# Patient Record
Sex: Female | Born: 1990 | Race: White | Hispanic: No | Marital: Single | Smoking: Never smoker
Health system: Southern US, Community
[De-identification: ages and names within clinical notes are randomized; demographics above are authoritative.]

## PROBLEM LIST (undated history)

## (undated) DIAGNOSIS — J45909 Unspecified asthma, uncomplicated: Secondary | ICD-10-CM

## (undated) DIAGNOSIS — R87612 Low grade squamous intraepithelial lesion on cytologic smear of cervix (LGSIL): Secondary | ICD-10-CM

## (undated) DIAGNOSIS — F909 Attention-deficit hyperactivity disorder, unspecified type: Secondary | ICD-10-CM

## (undated) DIAGNOSIS — R87629 Unspecified abnormal cytological findings in specimens from vagina: Secondary | ICD-10-CM

## (undated) HISTORY — DX: Unspecified abnormal cytological findings in specimens from vagina: R87.629

## (undated) HISTORY — PX: KNEE ARTHROSCOPY: SHX127

## (undated) HISTORY — DX: Unspecified asthma, uncomplicated: J45.909

## (undated) HISTORY — PX: NO PAST SURGERIES: SHX2092

## (undated) HISTORY — DX: Low grade squamous intraepithelial lesion on cytologic smear of cervix (LGSIL): R87.612

---
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
## 2010-02-12 ENCOUNTER — Emergency Department (HOSPITAL_COMMUNITY): Admission: EM | Admit: 2010-02-12 | Discharge: 2010-02-12 | Payer: Self-pay | Admitting: Family Medicine

## 2010-04-29 NOTE — Progress Notes (Signed)
Summary: triage/ear pain  Phone Note Call from Patient   Caller: Mom Reason for Call: Talk to Nurse Summary of Call: Mom called to say patient has been having ear aches for a couple of weeks now.Marland KitchenMarland KitchenIt started about the same time she went swimming. Patient can hear echo when she talks and feels like she has water in ear..Ear canal painful.  Advised mother we do not have any appointments open today. Patient can try OTC  medication for swimmers ear. She should talk to the pharmacists at the drug store.  Advised she can go to [facility] if she feels like she wanted to be seen today or if OTC meds do not help. Patient can take ibuprofen for pain as needed if not allergic. She should take with food. Mom states understanding. Initial call taken by: Conchita Paris,  November 06, 2009 11:29 AM

## 2010-05-15 ENCOUNTER — Emergency Department (HOSPITAL_COMMUNITY): Payer: No Typology Code available for payment source

## 2010-05-15 ENCOUNTER — Emergency Department (HOSPITAL_COMMUNITY)
Admission: EM | Admit: 2010-05-15 | Discharge: 2010-05-15 | Disposition: A | Discharge status: Home / Self Care | Payer: No Typology Code available for payment source | Attending: Emergency Medicine | Admitting: Emergency Medicine

## 2010-05-15 DIAGNOSIS — J45909 Unspecified asthma, uncomplicated: Secondary | ICD-10-CM | POA: Insufficient documentation

## 2010-05-15 DIAGNOSIS — F988 Other specified behavioral and emotional disorders with onset usually occurring in childhood and adolescence: Secondary | ICD-10-CM | POA: Insufficient documentation

## 2010-05-15 DIAGNOSIS — S060X0A Concussion without loss of consciousness, initial encounter: Secondary | ICD-10-CM | POA: Insufficient documentation

## 2010-05-15 DIAGNOSIS — S0120XA Unspecified open wound of nose, initial encounter: Secondary | ICD-10-CM | POA: Insufficient documentation

## 2010-05-15 DIAGNOSIS — E669 Obesity, unspecified: Secondary | ICD-10-CM | POA: Insufficient documentation

## 2010-05-15 DIAGNOSIS — IMO0002 Reserved for concepts with insufficient information to code with codable children: Secondary | ICD-10-CM | POA: Insufficient documentation

## 2010-05-15 LAB — CBC
Hemoglobin: 12.8 g/dL (ref 12.0–15.0)
Platelets: 284 10*3/uL (ref 150–400)
RBC: 4.06 MIL/uL (ref 3.87–5.11)
WBC: 5.8 10*3/uL (ref 4.0–10.5)

## 2010-05-15 LAB — POCT I-STAT, CHEM 8
BUN: 6 mg/dL (ref 6–23)
HCT: 38 % (ref 36.0–46.0)
Hemoglobin: 12.9 g/dL (ref 12.0–15.0)
Sodium: 139 mEq/L (ref 135–145)
TCO2: 20 mmol/L (ref 0–100)

## 2010-05-15 LAB — BASIC METABOLIC PANEL
CO2: 23 mEq/L (ref 19–32)
Chloride: 109 mEq/L (ref 96–112)
GFR calc Af Amer: >60 mL/min (ref >60)
Sodium: 140 mEq/L (ref 135–145)

## 2010-05-15 LAB — DIFFERENTIAL
Basophils Absolute: 0 10*3/uL (ref 0.0–0.1)
Basophils Relative: 1 % (ref 0–1)
Eosinophils Absolute: 0.2 10*3/uL (ref 0.0–0.7)
Neutro Abs: 3.1 10*3/uL (ref 1.7–7.7)
Neutrophils Relative %: 54 % (ref 43–77)
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
## 2010-06-08 LAB — POCT URINALYSIS DIPSTICK
Bilirubin Urine: NEGATIVE
Glucose, UA: 100 mg/dL — AB
Hgb urine dipstick: NEGATIVE
Ketones, ur: 40 mg/dL — AB
Nitrite: NEGATIVE
pH: 6 (ref 5.0–8.0)

## 2012-03-28 DIAGNOSIS — R87629 Unspecified abnormal cytological findings in specimens from vagina: Secondary | ICD-10-CM

## 2012-03-28 HISTORY — DX: Unspecified abnormal cytological findings in specimens from vagina: R87.629

## 2014-02-15 ENCOUNTER — Encounter (HOSPITAL_COMMUNITY): Payer: Self-pay | Admitting: Nurse Practitioner

## 2014-02-15 ENCOUNTER — Emergency Department (HOSPITAL_COMMUNITY): Payer: 59

## 2014-02-15 ENCOUNTER — Encounter (HOSPITAL_COMMUNITY): Payer: Self-pay | Admitting: *Deleted

## 2014-02-15 ENCOUNTER — Emergency Department (HOSPITAL_COMMUNITY)
Admission: EM | Admit: 2014-02-15 | Discharge: 2014-02-15 | Disposition: A | Discharge status: Home / Self Care | Payer: 59 | Attending: Emergency Medicine | Admitting: Emergency Medicine

## 2014-02-15 ENCOUNTER — Emergency Department (INDEPENDENT_AMBULATORY_CARE_PROVIDER_SITE_OTHER)
Admission: EM | Admit: 2014-02-15 | Discharge: 2014-02-15 | Disposition: A | Discharge status: Home / Self Care | Payer: 59 | Source: Home / Self Care | Attending: Emergency Medicine | Admitting: Emergency Medicine

## 2014-02-15 DIAGNOSIS — S39011A Strain of muscle, fascia and tendon of abdomen, initial encounter: Secondary | ICD-10-CM | POA: Insufficient documentation

## 2014-02-15 DIAGNOSIS — F909 Attention-deficit hyperactivity disorder, unspecified type: Secondary | ICD-10-CM | POA: Insufficient documentation

## 2014-02-15 DIAGNOSIS — Y990 Civilian activity done for income or pay: Secondary | ICD-10-CM | POA: Insufficient documentation

## 2014-02-15 DIAGNOSIS — X58XXXA Exposure to other specified factors, initial encounter: Secondary | ICD-10-CM | POA: Diagnosis not present

## 2014-02-15 DIAGNOSIS — R1012 Left upper quadrant pain: Secondary | ICD-10-CM | POA: Diagnosis present

## 2014-02-15 DIAGNOSIS — Z79899 Other long term (current) drug therapy: Secondary | ICD-10-CM | POA: Diagnosis not present

## 2014-02-15 DIAGNOSIS — Y9289 Other specified places as the place of occurrence of the external cause: Secondary | ICD-10-CM | POA: Diagnosis not present

## 2014-02-15 DIAGNOSIS — Y9389 Activity, other specified: Secondary | ICD-10-CM | POA: Diagnosis not present

## 2014-02-15 HISTORY — DX: Attention-deficit hyperactivity disorder, unspecified type: F90.9

## 2014-02-15 LAB — CBC WITH DIFFERENTIAL/PLATELET
Basophils Absolute: 0.1 10*3/uL (ref 0.0–0.1)
Basophils Relative: 1 % (ref 0–1)
Eosinophils Absolute: 0.2 10*3/uL (ref 0.0–0.7)
Eosinophils Relative: 2 % (ref 0–5)
HCT: 41.4 % (ref 36.0–46.0)
Hemoglobin: 13.8 g/dL (ref 12.0–15.0)
LYMPHS ABS: 2.2 10*3/uL (ref 0.7–4.0)
LYMPHS PCT: 30 % (ref 12–46)
MCH: 30.8 pg (ref 26.0–34.0)
MCHC: 33.3 g/dL (ref 30.0–36.0)
MCV: 92.4 fL (ref 78.0–100.0)
Monocytes Absolute: 0.5 10*3/uL (ref 0.1–1.0)
Monocytes Relative: 6 % (ref 3–12)
NEUTROS PCT: 61 % (ref 43–77)
Neutro Abs: 4.5 10*3/uL (ref 1.7–7.7)
PLATELETS: 381 10*3/uL (ref 150–400)
RBC: 4.48 MIL/uL (ref 3.87–5.11)
RDW: 12.2 % (ref 11.5–15.5)
WBC: 7.4 10*3/uL (ref 4.0–10.5)

## 2014-02-15 LAB — COMPREHENSIVE METABOLIC PANEL
ALT: 29 U/L (ref 0–35)
ANION GAP: 16 — AB (ref 5–15)
AST: 32 U/L (ref 0–37)
Albumin: 3.6 g/dL (ref 3.5–5.2)
Alkaline Phosphatase: 39 U/L (ref 39–117)
BUN: 8 mg/dL (ref 6–23)
CALCIUM: 9.4 mg/dL (ref 8.4–10.5)
CO2: 23 meq/L (ref 19–32)
CREATININE: 0.75 mg/dL (ref 0.50–1.10)
Chloride: 101 mEq/L (ref 96–112)
GLUCOSE: 82 mg/dL (ref 70–99)
Potassium: 4.1 mEq/L (ref 3.7–5.3)
Sodium: 140 mEq/L (ref 137–147)
Total Bilirubin: 0.3 mg/dL (ref 0.3–1.2)
Total Protein: 8.1 g/dL (ref 6.0–8.3)

## 2014-02-15 LAB — POCT URINALYSIS DIP (DEVICE)
Bilirubin Urine: NEGATIVE
Glucose, UA: NEGATIVE mg/dL
HGB URINE DIPSTICK: NEGATIVE
Nitrite: NEGATIVE
PROTEIN: NEGATIVE mg/dL
SPECIFIC GRAVITY, URINE: 1.02 (ref 1.005–1.030)
UROBILINOGEN UA: 1 mg/dL (ref 0.0–1.0)
pH: 7 (ref 5.0–8.0)

## 2014-02-15 LAB — LIPASE, BLOOD: LIPASE: 18 U/L (ref 11–59)

## 2014-02-15 LAB — POCT PREGNANCY, URINE: PREG TEST UR: NEGATIVE

## 2014-02-15 MED ORDER — IOHEXOL 300 MG/ML  SOLN
100.0000 mL | Freq: Once | INTRAMUSCULAR | Status: AC | PRN
  Administered 2014-02-15: 100 mL via INTRAVENOUS

## 2014-02-15 MED ORDER — MORPHINE SULFATE 4 MG/ML IJ SOLN
4.0000 mg | Freq: Once | INTRAMUSCULAR | Status: AC
  Administered 2014-02-15: 4 mg via INTRAVENOUS
  Filled 2014-02-15: qty 1

## 2014-02-15 MED ORDER — CYCLOBENZAPRINE HCL 10 MG PO TABS
5.0000 mg | ORAL_TABLET | Freq: Once | ORAL | Status: AC
  Administered 2014-02-15: 5 mg via ORAL
  Filled 2014-02-15 (×2): qty 1

## 2014-02-15 MED ORDER — METHOCARBAMOL 500 MG PO TABS: 500.0000 mg | ORAL_TABLET | Freq: Two times a day (BID) | ORAL | Status: DC

## 2014-02-15 MED ORDER — OXYCODONE-ACETAMINOPHEN 5-325 MG PO TABS: 1.0000 | ORAL_TABLET | Freq: Four times a day (QID) | ORAL | Status: DC | PRN

## 2014-02-15 MED ORDER — ONDANSETRON HCL 4 MG PO TABS: 4.0000 mg | ORAL_TABLET | Freq: Four times a day (QID) | ORAL | Status: DC

## 2014-02-15 MED ORDER — SODIUM CHLORIDE 0.9 % IV BOLUS (SEPSIS)
1000.0000 mL | Freq: Once | INTRAVENOUS | Status: AC
  Administered 2014-02-15: 1000 mL via INTRAVENOUS

## 2014-02-15 MED ORDER — IOHEXOL 300 MG/ML  SOLN
25.0000 mL | Freq: Once | INTRAMUSCULAR | Status: AC | PRN
  Administered 2014-02-15: 25 mL via ORAL

## 2014-02-15 MED ORDER — ONDANSETRON HCL 4 MG/2ML IJ SOLN
4.0000 mg | Freq: Once | INTRAMUSCULAR | Status: AC
  Administered 2014-02-15: 4 mg via INTRAVENOUS
  Filled 2014-02-15: qty 2

## 2014-02-15 MED ORDER — KETOROLAC TROMETHAMINE 30 MG/ML IJ SOLN
30.0000 mg | Freq: Once | INTRAMUSCULAR | Status: AC
  Administered 2014-02-15: 30 mg via INTRAVENOUS
  Filled 2014-02-15 (×2): qty 1

## 2014-02-15 NOTE — ED Notes (Signed)
ED First Nurse, Karolee StampsJanelle, notified of pt.

## 2014-02-15 NOTE — ED Notes (Signed)
Patient provided a urine specimen prior to being placed in exam room

## 2014-02-15 NOTE — ED Notes (Signed)
Patient finished with contrast 

## 2014-02-15 NOTE — ED Notes (Signed)
Reports "stabbing" pain in LUQ that woke her out of a sleep Thurs night; pain eventually eased so she could return to sleep.  Yesterday had some intermittent episodes, but now pain has remained constant since late yesterday.  Describes as a cramping sensation that feels like it's "rolling" from LUQ to epigastrum.  Denies constipation, n/v/d, fevers.  Pain worse with sitting and feels tender to touch.  Last BM yesterday - reports as normal.  Has tried omeprazole without any change.

## 2014-02-15 NOTE — Discharge Instructions (Signed)
°  It is my recommendation that [facility]. Please do not eat or drink on your way.  Urine studies were normal.  Abdominal Pain Many things can cause abdominal pain. Usually, abdominal pain is not caused by a disease and will improve without treatment. It can often be observed and treated at home. Your health care provider will do a physical exam and possibly order blood tests and X-rays to help determine the seriousness of your pain. However, in many cases, more time must pass before a clear cause of the pain can be found. Before that point, your health care provider may not know if you need more testing or further treatment. HOME CARE INSTRUCTIONS  Monitor your abdominal pain for any changes. The following actions may help to alleviate any discomfort you are experiencing:  Only take over-the-counter or prescription medicines as directed by your health care provider.  Do not take laxatives unless directed to do so by your health care provider.  Try a clear liquid diet (broth, tea, or water) as directed by your health care provider. Slowly move to a bland diet as tolerated. SEEK MEDICAL CARE IF:  You have unexplained abdominal pain.  You have abdominal pain associated with nausea or diarrhea.  You have pain when you urinate or have a bowel movement.  You experience abdominal pain that wakes you in the night.  You have abdominal pain that is worsened or improved by eating food.  You have abdominal pain that is worsened with eating fatty foods.  You have a fever. SEEK IMMEDIATE MEDICAL CARE IF:   Your pain does not go away within 2 hours.  You keep throwing up (vomiting).  Your pain is felt only in portions of the abdomen, such as the right side or the left lower portion of the abdomen.  You pass bloody or black tarry stools. MAKE SURE YOU:  Understand these instructions.   Will watch your condition.   Will get help right away if you  are not doing well or get worse.  Document Released: 12/22/2004 Document Revised: 03/19/2013 Document Reviewed: 11/21/2012 [facility] Patient Information 2015 [place], [place]. This information is not intended to replace advice given to you by your health care provider. Make sure you discuss any questions you have with your health care provider.

## 2014-02-15 NOTE — ED Notes (Signed)
Pt states she finished with her oral CT contrast. Annabelle Harmanana from CT informed.

## 2014-02-15 NOTE — ED Provider Notes (Signed)
CSN: [id number]     Arrival date & time 02/15/14  1613 History   First MD Initiated Contact with Patient 02/15/14 1652     Chief Complaint  Patient presents with  . Abdominal Pain   (Consider location/radiation/quality/duration/timing/severity/associated sxs/prior Treatment) Patient is a 23 y.o. female presenting with abdominal pain. The history is provided by the patient.  Abdominal Pain Pain location:  LUQ Pain quality: sharp   Pain radiates to:  Epigastric region Pain severity:  Moderate Onset quality:  Gradual Duration:  24 hours Timing:  Constant Progression:  Worsening Chronicity:  New Context: awakening from sleep   Associated symptoms: cough   Associated symptoms: no anorexia, no chest pain, no chills, no constipation, no diarrhea, no dysuria, no fatigue, no fever, no hematemesis, no hematuria, no melena, no nausea, no shortness of breath, no sore throat, no vaginal bleeding, no vaginal discharge and no vomiting   Associated symptoms comment:  +reports recent URI symptoms Risk factors: has not had multiple surgeries     Past Medical History  Diagnosis Date  . ADHD (attention deficit hyperactivity disorder)    History reviewed. No pertinent past surgical history. No family history on file. History  Substance Use Topics  . Smoking status: Never Smoker   . Smokeless tobacco: Not on file  . Alcohol Use: No   OB History    No data available     Review of Systems  Constitutional: Negative for fever, chills, appetite change and fatigue.  HENT: Negative.  Negative for sore throat.   Respiratory: Positive for cough. Negative for chest tightness and shortness of breath.   Cardiovascular: Negative for chest pain.  Gastrointestinal: Positive for abdominal pain. Negative for nausea, vomiting, diarrhea, constipation, blood in stool, melena, abdominal distention, anorexia and hematemesis.  Genitourinary: Negative.  Negative for dysuria, hematuria, vaginal bleeding and  vaginal discharge.  Musculoskeletal: Negative for back pain.  Skin: Negative.   Neurological: Negative for dizziness and light-headedness.    Allergies  Review of patient's allergies indicates no known allergies.  Home Medications   Prior to Admission medications   Medication Sig Start Date End Date Taking? Authorizing Provider  lisdexamfetamine (VYVANSE) 50 MG capsule Take 50 mg by mouth daily.   Yes Historical Provider, MD  UNKNOWN TO PATIENT BCPs   Yes Historical Provider, MD   BP 126/76 mmHg  Pulse 104  Temp(Src) 97.9 F (36.6 C) (Oral)  Resp 16  SpO2 98%  LMP 01/24/2014 (Approximate) Physical Exam  Constitutional: She is oriented to person, place, and time. She appears well-developed and well-nourished. No distress.  HENT:  Head: Normocephalic and atraumatic.  Eyes: Conjunctivae are normal. No scleral icterus.  Cardiovascular: Normal rate, regular rhythm and normal heart sounds.   Pulmonary/Chest: Effort normal and breath sounds normal.  Abdominal: Soft. Normal appearance and bowel sounds are normal. She exhibits no mass. There is tenderness. There is no rigidity, no rebound, no guarding and no CVA tenderness.    Neurological: She is alert and oriented to person, place, and time.  Skin: Skin is warm and dry.  Psychiatric: She has a normal mood and affect. Her behavior is normal.  Nursing note and vitals reviewed.   ED Course  Procedures (including critical care time) Labs Review Labs Reviewed  POCT URINALYSIS DIP (DEVICE) - Abnormal; Notable for the following:    Ketones, ur TRACE (*)    Leukocytes, UA SMALL (*)    All other components within normal limits  POCT PREGNANCY, URINE    Imaging  Review No results found.   MDM   1. Left upper quadrant pain   Urine studies unremarkable UPT negative Advised patient to remain NPO and [facility].     Ria ClockJennifer Lee H Odaliz Mcqueary, GeorgiaPA 02/15/14 1723

## 2014-02-15 NOTE — Discharge Instructions (Signed)
Abdominal Pain Many things can cause abdominal pain. Usually, abdominal pain is not caused by a disease and will improve without treatment. It can often be observed and treated at home. Your health care provider will do a physical exam and possibly order blood tests and X-rays to help determine the seriousness of your pain. However, in many cases, more time must pass before a clear cause of the pain can be found. Before that point, your health care provider may not know if you need more testing or further treatment. HOME CARE INSTRUCTIONS  Monitor your abdominal pain for any changes. The following actions may help to alleviate any discomfort you are experiencing:  Only take over-the-counter or prescription medicines as directed by your health care provider.  Do not take laxatives unless directed to do so by your health care provider.  Try a clear liquid diet (broth, tea, or water) as directed by your health care provider. Slowly move to a bland diet as tolerated. SEEK MEDICAL CARE IF:  You have unexplained abdominal pain.  You have abdominal pain associated with nausea or diarrhea.  You have pain when you urinate or have a bowel movement.  You experience abdominal pain that wakes you in the night.  You have abdominal pain that is worsened or improved by eating food.  You have abdominal pain that is worsened with eating fatty foods.  You have a fever. SEEK IMMEDIATE MEDICAL CARE IF:   Your pain does not go away within 2 hours.  You keep throwing up (vomiting).  Your pain is felt only in portions of the abdomen, such as the right side or the left lower portion of the abdomen.  You pass bloody or black tarry stools. MAKE SURE YOU:  Understand these instructions.   Will watch your condition.   Will get help right away if you are not doing well or get worse.  Document Released: 12/22/2004 Document Revised: 03/19/2013 Document Reviewed: 11/21/2012 [facility] Patient Information  2015 [place], [place]LL[place]. This information is not intended to replace advice given to you by your health care provider. Make sure you discuss any questions you have with your health care provider. Muscle Pain Muscle pain (myalgia) may be caused by many things, including:  Overuse or muscle strain, especially if you are not in shape. This is the most common cause of muscle pain.  Injury.  Bruises.  Viruses, such as the flu.  Infectious diseases.  Fibromyalgia, which is a chronic condition that causes muscle tenderness, fatigue, and headache.  Autoimmune diseases, including lupus.  Certain drugs, including ACE inhibitors and statins. Muscle pain may be mild or severe. In most cases, the pain lasts only a short time and goes away without treatment. To diagnose the cause of your muscle pain, your health care provider will take your medical history. This means he or she will ask you when your muscle pain began and what has been happening. If you have not had muscle pain for very long, your health care provider may want to wait before doing much testing. If your muscle pain has lasted a long time, your health care provider may want to run tests right away. If your health care provider thinks your muscle pain may be caused by illness, you may need to have additional tests to rule out certain conditions.  Treatment for muscle pain depends on the cause. Home care is often enough to relieve muscle pain. Your health care provider may also prescribe anti-inflammatory medicine. HOME CARE INSTRUCTIONS Watch your  condition for any changes. The following actions may help to lessen any discomfort you are feeling:  Only take over-the-counter or prescription medicines as directed by your health care provider.  Apply ice to the sore muscle:  Put ice in a plastic bag.  Place a towel between your skin and the bag.  Leave the ice on for 15-20 minutes, 3-4 times a day.  You may alternate applying hot and cold  packs to the muscle as directed by your health care provider.  If overuse is causing your muscle pain, slow down your activities until the pain goes away.  Remember that it is normal to feel some muscle pain after starting a workout program. Muscles that have not been used often will be sore at first.  Do regular, gentle exercises if you are not usually active.  Warm up before exercising to lower your risk of muscle pain.  Do not continue working out if the pain is very bad. Bad pain could mean you have injured a muscle. SEEK MEDICAL CARE IF:  Your muscle pain gets worse, and medicines do not help.  You have muscle pain that lasts longer than 3 days.  You have a rash or fever along with muscle pain.  You have muscle pain after a tick bite.  You have muscle pain while working out, even though you are in good physical condition.  You have redness, soreness, or swelling along with muscle pain.  You have muscle pain after starting a new medicine or changing the dose of a medicine. SEEK IMMEDIATE MEDICAL CARE IF:  You have trouble breathing.  You have trouble swallowing.  You have muscle pain along with a stiff neck, fever, and vomiting.  You have severe muscle weakness or cannot move part of your body. MAKE SURE YOU:   Understand these instructions.  Will watch your condition.  Will get help right away if you are not doing well or get worse. Document Released: 02/03/2006 Document Revised: 03/19/2013 Document Reviewed: 01/08/2013 [facility] Patient Information 2015 [place]ExitCare, [place]LLC. This information is not intended to replace advice given to you by your health care provider. Make sure you discuss any questions you have with your health care provider.

## 2014-02-15 NOTE — ED Notes (Addendum)
She reports LUQ abd pain since Thursday. She denies bowel/bladder changes, n/v, fevers. Last BM was yesterday, normal. [facility]

## 2014-02-15 NOTE — ED Provider Notes (Signed)
CSN: [id number]     Arrival date & time 02/15/14  1734 History   First MD Initiated Contact with Patient 02/15/14 1902     Chief Complaint  Patient presents with  . Abdominal Pain     (Consider location/radiation/quality/duration/timing/severity/associated sxs/prior Treatment) HPI  Patient to the ER with complaints of LUQ pain since Thursday (11/19?Marland Kitchen. She was seen at the Urgent Care and advised to go to the ED for further evaluation. She [facility] which [facility]. She does heavy lifting for work but does not remember injuring herself or lifting something heavy. She denies having any chest pains, acid reflux, cough, pain with breathing, coughing, or worsened pain with movement. She says the pain is worse with laying and sitting. Relieved with standing. She has no significant past medical history.    Past Medical History  Diagnosis Date  . ADHD (attention deficit hyperactivity disorder)    History reviewed. No pertinent past surgical history. History reviewed. No pertinent family history. History  Substance Use Topics  . Smoking status: Never Smoker   . Smokeless tobacco: Not on file  . Alcohol Use: No   OB History    No data available     Review of Systems  10 Systems reviewed and are negative for acute change except as noted in the HPI.    Allergies  Review of patient's allergies indicates no known allergies.  Home Medications   Prior to Admission medications   Medication Sig Start Date End Date Taking? Authorizing Provider  lisdexamfetamine (VYVANSE) 50 MG capsule Take 50 mg by mouth daily.   Yes Historical Provider, MD  omeprazole (PRILOSEC OTC) 20 MG tablet Take 20 mg by mouth once.   Yes Historical Provider, MD  UNKNOWN TO PATIENT OCPs. Pt does not know the name of it.   Yes Historical Provider, MD   BP 120/75 mmHg  Pulse 97  Temp(Src) 98.3 F (36.8 C) (Oral)  Resp 17  Ht 5\' 1"  (1.549 m)  Wt 150 lb (68.04 kg)  BMI 28.36  kg/m2  SpO2 95%  LMP 01/24/2014 (Approximate) Physical Exam  Constitutional: She appears well-developed and well-nourished. No distress.  HENT:  Head: Normocephalic and atraumatic.  Eyes: Pupils are equal, round, and reactive to light.  Neck: Normal range of motion. Neck supple.  Cardiovascular: Normal rate and regular rhythm.   Pulmonary/Chest: Effort normal.  Abdominal: Soft. Bowel sounds are normal. She exhibits no distension, no fluid wave, no ascites and no mass. There is no hepatosplenomegaly. There is tenderness in the left upper quadrant. There is no rigidity, no rebound, no guarding and no CVA tenderness.    Neurological: She is alert.  Skin: Skin is warm and dry.  Nursing note and vitals reviewed.    ED Course  Procedures (including critical care time) Labs Review Labs Reviewed  COMPREHENSIVE METABOLIC PANEL - Abnormal; Notable for the following:    Anion gap 16 (*)    All other components within normal limits  CBC WITH DIFFERENTIAL  LIPASE, BLOOD    Imaging Review Ct Abdomen Pelvis W Contrast  02/15/2014   CLINICAL DATA:  Left upper quadrant abdominal pain for 2 days. Initial encounter.  EXAM: CT ABDOMEN AND PELVIS WITH CONTRAST  TECHNIQUE: Multidetector CT imaging of the abdomen and pelvis was performed using the standard protocol following bolus administration of intravenous contrast.  CONTRAST:  100mL OMNIPAQUE IOHEXOL 300 MG/ML  SOLN  COMPARISON:  None.  FINDINGS: Lower chest: Clear  lung bases. No significant pleural or pericardial effusion.  Hepatobiliary: The liver demonstrates mildly heterogeneous steatosis, but no focally suspicious lesion. No evidence of gallstones, gallbladder wall thickening or biliary dilatation.  Pancreas: Unremarkable. No pancreatic ductal dilatation or surrounding inflammatory changes.  Spleen: Normal in size without focal abnormality.  Adrenals/Urinary Tract: Both adrenal glands appear normal.Both kidneys appear normal without evidence of  urinary tract calculus or hydronephrosis. The bladder is incompletely distended. This likely accounts for mild prominence of its wall. There is no surrounding inflammatory change.  Stomach/Bowel: No evidence of bowel wall thickening, distention or surrounding inflammatory change.The appendix appears normal. There is no ascites or extraluminal fluid collection.  Vascular/Lymphatic: There are no enlarged abdominal or pelvic lymph nodes. Small mesenteric lymph nodes are not pathologically enlarged. There are no significant vascular findings.  Reproductive: The uterus and ovaries appear normal.  Other: No evidence of abdominal wall mass or hernia.  Musculoskeletal: No acute or significant osseous findings.  IMPRESSION: 1. No acute findings or explanation for the patient's symptoms identified. 2. Mildly heterogeneous hepatic steatosis. 3. Incomplete bladder distension likely accounts for mild prominence of its wall.   Electronically Signed   By: Roxy HorsemanBill  Veazey M.D.   On: 02/15/2014 21:10     EKG Interpretation None      MDM   Final diagnoses:  Left upper quadrant pain   Patients urinalysis and urine preg are unremarkable. Her blood work is unremarkable. Her CT of the abd/pelv shows no acute findings or explanation of pts symptoms. No pelvic pain, RUQ pain or pain elsewhere aside from the LUQ.  Symptoms consistent with muscular etiology. Pain maneagable in the ED.  Medications  morphine 4 MG/ML injection 4 mg (4 mg Intravenous Given 02/15/14 2006)  sodium chloride 0.9 % bolus 1,000 mL (1,000 mLs Intravenous New Bag/Given 02/15/14 2004)  ondansetron (ZOFRAN) injection 4 mg (4 mg Intravenous Given 02/15/14 2005)  iohexol (OMNIPAQUE) 300 MG/ML solution 25 mL (25 mLs Oral Contrast Given 02/15/14 1947)  iohexol (OMNIPAQUE) 300 MG/ML solution 100 mL (100 mLs Intravenous Contrast Given 02/15/14 2049)  ketorolac (TORADOL) 30 MG/ML injection 30 mg (30 mg Intravenous Given 02/15/14 2149)  cyclobenzaprine  (FLEXERIL) tablet 5 mg (5 mg Oral Given 02/15/14 2149)   23 y.o.Caroline Woods evaluation in the Emergency Department is complete. It has been determined that no acute conditions requiring further emergency intervention are present at this time. The patient/guardian have been advised of the diagnosis and plan. We have discussed signs and symptoms that warrant return to the ED, such as changes or worsening in symptoms.  Vital signs are stable at discharge. Filed Vitals:   02/15/14 2145  BP: 120/75  Pulse: 97  Temp:   Resp: 17    Patient/guardian has voiced understanding and agreed to follow-up with the PCP or specialist.    Dorthula Matasiffany G Keiffer Piper, PA-C 02/15/14 2229  Elwin MochaBlair Walden, MD 02/15/14 2239

## 2014-09-02 ENCOUNTER — Encounter: Payer: Self-pay | Admitting: Obstetrics & Gynecology

## 2014-09-02 DIAGNOSIS — Z01419 Encounter for gynecological examination (general) (routine) without abnormal findings: Secondary | ICD-10-CM

## 2014-09-03 ENCOUNTER — Encounter (HOSPITAL_COMMUNITY): Payer: Self-pay | Admitting: Emergency Medicine

## 2014-09-03 ENCOUNTER — Emergency Department (HOSPITAL_COMMUNITY)
Admission: EM | Admit: 2014-09-03 | Discharge: 2014-09-03 | Disposition: A | Discharge status: Home / Self Care | Payer: 59 | Source: Home / Self Care | Attending: Emergency Medicine | Admitting: Emergency Medicine

## 2014-09-03 DIAGNOSIS — T63301A Toxic effect of unspecified spider venom, accidental (unintentional), initial encounter: Secondary | ICD-10-CM

## 2014-09-03 MED ORDER — CEPHALEXIN 500 MG PO CAPS
500.0000 mg | ORAL_CAPSULE | Freq: Four times a day (QID) | ORAL | Status: DC
Start: 2014-09-03 — End: 2014-11-26

## 2014-09-03 NOTE — ED Notes (Signed)
Reports possible insect bite to upper right thigh.  Noticed yesterday.  Heat, redness, and irritation of the area.  No otc treatments tried.

## 2014-09-03 NOTE — Discharge Instructions (Signed)
You likely have a spider bite. This is the typical reaction to a spider bite. If the redness and swelling are not improving by Friday, please fill the Keflex and take 4 times a day for 1 week. You can use cold compresses to help the swelling. Use over-the-counter hydrocortisone cream or Benadryl cream to help the itching. Follow-up as needed.

## 2014-09-03 NOTE — ED Provider Notes (Signed)
CSN: [id number]     Arrival date & time 09/03/14  1446 History   First MD Initiated Contact with Patient 09/03/14 1505     Chief Complaint  Patient presents with  . Insect Bite   (Consider location/radiation/quality/duration/timing/severity/associated sxs/prior Treatment) HPI  She is a 24 year old woman here with her mother for evaluation of an insect bite. She reports noticing an itchy bump 2 days ago on her right upper thigh. She does not know what bit her. Over the last 2 days it has gotten more red, swollen, and tender. She states it itches. Last night, she noticed some central bruising. At that time, she showed it to her mother who was concerned for a possible spider bite. She has not tried any medications.  Past Medical History  Diagnosis Date  . ADHD (attention deficit hyperactivity disorder)    History reviewed. No pertinent past surgical history. History reviewed. No pertinent family history. History  Substance Use Topics  . Smoking status: Never Smoker   . Smokeless tobacco: Not on file  . Alcohol Use: No   OB History    No data available     Review of Systems As in history of present illness Allergies  Review of patient's allergies indicates no known allergies.  Home Medications   Prior to Admission medications   Medication Sig Start Date End Date Taking? Authorizing Provider  Norgestim-Eth Estrad Triphasic (ORTHO TRI-CYCLEN, 28, PO) Take by mouth.   Yes Historical Provider, MD  cephALEXin (KEFLEX) 500 MG capsule Take 1 capsule (500 mg total) by mouth 4 (four) times daily. 09/03/14   Charm RingsErin J Honig, MD  lisdexamfetamine (VYVANSE) 50 MG capsule Take 50 mg by mouth daily.    Historical Provider, MD  methocarbamol (ROBAXIN) 500 MG tablet Take 1 tablet (500 mg total) by mouth 2 (two) times daily. 02/15/14   Tiffany Neva SeatGreene, PA-C  omeprazole (PRILOSEC OTC) 20 MG tablet Take 20 mg by mouth once.    Historical Provider, MD  ondansetron (ZOFRAN) 4 MG tablet Take 1 tablet (4 mg  total) by mouth every 6 (six) hours. 02/15/14   Marlon Peliffany Greene, PA-C  oxyCODONE-acetaminophen (PERCOCET/ROXICET) 5-325 MG per tablet Take 1-2 tablets by mouth every 6 (six) hours as needed. 02/15/14   Tiffany Neva SeatGreene, PA-C  UNKNOWN TO PATIENT OCPs. Pt does not know the name of it.    Historical Provider, MD   BP 122/81 mmHg  Pulse 120  Temp(Src) 97.9 F (36.6 C) (Oral)  Resp 16  SpO2 97%  LMP 08/24/2014 Physical Exam  Constitutional: She is oriented to person, place, and time. She appears well-developed and well-nourished. No distress.  Cardiovascular: Normal rate.   Pulmonary/Chest: Effort normal.  Neurological: She is alert and oriented to person, place, and time.  Skin:  She has a 6 cm area of erythema with a 2 cm area of swelling and warmth at the superior aspect. There is a central area of purple discoloration.    ED Course  Procedures (including critical care time) Labs Review Labs Reviewed - No data to display  Imaging Review No results found.   MDM   1. Spider bite, accidental or unintentional, initial encounter    Exam is consistent with expected local reaction. Recommended cold compresses and over-the-counter hydrocortisone or Benadryl cream to help with the itching. Prescription for Keflex given to be filled if no improvement by Friday. Follow-up as needed.    Charm RingsErin J Honig, MD 09/03/14 [phone]

## 2014-10-29 ENCOUNTER — Ambulatory Visit (INDEPENDENT_AMBULATORY_CARE_PROVIDER_SITE_OTHER): Payer: 59 | Admitting: Advanced Practice Midwife

## 2014-10-29 ENCOUNTER — Encounter: Payer: Self-pay | Admitting: Advanced Practice Midwife

## 2014-10-29 VITALS — BP 134/93 | HR 59 | Resp 20 | Ht 61.0 in | Wt 193.0 lb

## 2014-10-29 DIAGNOSIS — Z1151 Encounter for screening for human papillomavirus (HPV): Secondary | ICD-10-CM

## 2014-10-29 DIAGNOSIS — Z124 Encounter for screening for malignant neoplasm of cervix: Secondary | ICD-10-CM | POA: Diagnosis not present

## 2014-10-29 DIAGNOSIS — R635 Abnormal weight gain: Secondary | ICD-10-CM | POA: Diagnosis not present

## 2014-10-29 DIAGNOSIS — Z01419 Encounter for gynecological examination (general) (routine) without abnormal findings: Secondary | ICD-10-CM

## 2014-10-29 LAB — TSH: TSH: 1.682 u[IU]/mL (ref 0.350–4.500)

## 2014-10-29 NOTE — Progress Notes (Signed)
  Subjective:     Caroline Woods is a 24 y.o. woman who comes in today for a annual gyn exam. Her most recent annual exam was on 2014. Her most recent Pap smear was on 2014 and showed May have been ASCUS, but pt unsure. Contraception: OCP (estrogen/progesterone)   Regular 28 cycles. Light bleeding.   The following portions of the patient's history were reviewed and updated as appropriate: allergies, current medications, past family history, past medical history, past social history, past surgical history and problem list. No Hx HTN. Has ADHD, on Vyvanse   Review of Systems A comprehensive review of systems was negative.  except for unexpected weight gain. Has been busy taking care of grandparents.   Objective:    BP 134/93 mmHg  Pulse 59  Resp 20  Ht  (1.549 m)  Wt 193 lb (87.544 kg)  BMI 36.49 kg/m2  LMP 10/14/2014  Physical Examination: General appearance - alert, well appearing, and in no distress, oriented to person, place, and time and overweight Neck - supple, no significant adenopathy, thyroid exam: mildly enlarged w/out palpable nodules Heart - normal rate, regular rhythm, normal S1, S2, no murmurs, rubs, clicks or gallops Abdomen - soft, nontender, nondistended, no masses or organomegaly Breasts - breasts appear normal, no suspicious masses, no skin or nipple changes or axillary nodes, risk and benefit of breast self-exam was discussed Pelvic - normal external genitalia, vulva, vagina, cervix, uterus and adnexa, PAP: Pap smear done today Neurological - alert, oriented, normal speech, no focal findings or movement disorder noted Extremities - no pedal edema noted  Assessment:    Screening pap smear.   1. Unexplained weight gain  - TSH  2. Screening for malignant neoplasm of cervix  - Cervical or vaginal cytopath, thin prep   Plan:    Follow up in 1 year, or as indicated by Pap results.    [place], [place] 10/29/2014 5:06 PM

## 2014-10-29 NOTE — Patient Instructions (Addendum)
Pap Test A Pap test is a procedure done in a clinic office to evaluate cells that are on the surface of the cervix. The cervix is the lower portion of the uterus and upper portion of the vagina. For some women, the cervical region has the potential to form cancer. With consistent evaluations by your caregiver, this type of cancer can be prevented.  If a Pap test is abnormal, it is most often a result of a previous exposure to human papillomavirus (HPV). HPV is a virus that can infect the cells of the cervix and cause dysplasia. Dysplasia is where the cells no longer look normal. If a woman has been diagnosed with high-grade or severe dysplasia, they are at higher risk of developing cervical cancer. People diagnosed with low-grade dysplasia should still be seen by their caregiver because there is a small chance that low-grade dysplasia could develop into cancer.  LET YOUR CAREGIVER KNOW ABOUT:  Recent sexually transmitted infection (STI) you have had.  Any new sex partners you have had.  History of previous abnormal Pap tests results.  History of previous cervical procedures you have had (colposcopy, biopsy, loop electrosurgical excision procedure [LEEP]).  Concerns you have had regarding unusual vaginal discharge.  History of pelvic pain.  Your use of birth control. BEFORE THE PROCEDURE  Ask your caregiver when to schedule your Pap test. It is best not to be on your period if your caregiver uses a wooden spatula to collect cells or applies cells to a glass slide. Newer techniques are not so sensitive to the timing of a menstrual cycle.  Do not douche or have sexual intercourse for 24 hours before the test.   Do not use vaginal creams or tampons for 24 hours before the test.   Empty your bladder just before the test to lessen any discomfort.  PROCEDURE You will lie on an exam table with your feet in stirrups. A warm metal or plastic instrument (speculum) is placed in your vagina. This  instrument allows your caregiver to see the inside of your vagina and look at your cervix. A small, plastic brush or wooden spatula is then used to collect cervical cells. These cells are placed in a lab specimen container. The cells are looked at under a microscope. A specialist will determine if the cells are normal.  AFTER THE PROCEDURE Make sure to get your test results.If your results come back abnormal, you may need further testing.  Document Released: 06/04/2002 Document Revised: 06/06/2011 Document Reviewed: 03/10/2011 [facility] Patient Information 2015 [place], [place]. This information is not intended to replace advice given to you by your health care provider. Make sure you discuss any questions you have with your health care provider.   Thank you for enrolling in MyChart. Please follow the instructions below to securely access your online medical record. MyChart allows you to send messages to your doctor, view your test results, manage appointments, and more.   How Do I Sign Up? 1. In your Internet browser, go to [employer] and enter [email or web address]. 2. Click on the Sign Up Now link in the Sign In box. You will see the New Member Sign Up page. 3. Enter your MyChart Access Code exactly as it appears below. You will not need to use this code after you've completed the sign-up process. If you do not sign up before the expiration date, you must request a new code.  MyChart Access Code: [email or web address] Expires: 11/02/2014  3:45 PM  4. Enter your Social  Security Number ([email or web address]) and Date of Birth (mm/dd/yyyy) as indicated and click Submit. You will be taken to the next sign-up page. 5. Create a MyChart ID. This will be your MyChart login ID and cannot be changed, so think of one that is secure and easy to remember. 6. Create a MyChart password. You can change your password at any time. 7. Enter your Password Reset Question and Answer. This can be used at a later time  if you forget your password.  8. Enter your e-mail address. You will receive e-mail notification when new information is available in MyChart. 9. Click Sign Up. You can now view your medical record.   Additional Information Remember, MyChart is NOT to be used for urgent needs. For medical emergencies, dial 911.

## 2014-10-30 ENCOUNTER — Telehealth: Payer: Self-pay | Admitting: *Deleted

## 2014-10-30 NOTE — Telephone Encounter (Signed)
-----   Message from [place], [place] sent at 10/30/2014  2:12 PM EDT ----- Please notify patient of normal thyroid-stimulating hormone.

## 2014-10-30 NOTE — Telephone Encounter (Signed)
Pt notified of normal TSH and still waiting on PAP - Pt notified that PAP will be mailed unless PAP is abnormal, in which case we will call to go over Tx plan. Pt expressed understanding.

## 2014-10-31 LAB — CYTOLOGY - PAP

## 2014-11-11 ENCOUNTER — Telehealth: Payer: Self-pay | Admitting: *Deleted

## 2014-11-11 ENCOUNTER — Encounter: Payer: Self-pay | Admitting: Advanced Practice Midwife

## 2014-11-11 DIAGNOSIS — R87612 Low grade squamous intraepithelial lesion on cytologic smear of cervix (LGSIL): Secondary | ICD-10-CM

## 2014-11-11 HISTORY — DX: Low grade squamous intraepithelial lesion on cytologic smear of cervix (LGSIL): R87.612

## 2014-11-11 NOTE — Telephone Encounter (Signed)
Pt called stating she is still waiting for the results to her PAP. She didn't receive anything in the mail or get a call.

## 2014-11-11 NOTE — Telephone Encounter (Signed)
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  Pt notified of result and will schedule Colpo to follow-up.

## 2014-11-26 ENCOUNTER — Ambulatory Visit (INDEPENDENT_AMBULATORY_CARE_PROVIDER_SITE_OTHER): Payer: 59 | Admitting: Obstetrics & Gynecology

## 2014-11-26 ENCOUNTER — Encounter: Payer: Self-pay | Admitting: Obstetrics & Gynecology

## 2014-11-26 ENCOUNTER — Encounter: Payer: Self-pay | Admitting: *Deleted

## 2014-11-26 VITALS — BP 137/95 | HR 109 | Wt 196.0 lb

## 2014-11-26 DIAGNOSIS — IMO0002 Reserved for concepts with insufficient information to code with codable children: Secondary | ICD-10-CM

## 2014-11-26 DIAGNOSIS — R87810 Cervical high risk human papillomavirus (HPV) DNA test positive: Secondary | ICD-10-CM | POA: Diagnosis not present

## 2014-11-26 DIAGNOSIS — R87612 Low grade squamous intraepithelial lesion on cytologic smear of cervix (LGSIL): Secondary | ICD-10-CM | POA: Diagnosis not present

## 2014-11-26 DIAGNOSIS — N870 Mild cervical dysplasia: Secondary | ICD-10-CM | POA: Insufficient documentation

## 2014-11-26 DIAGNOSIS — Z01812 Encounter for preprocedural laboratory examination: Secondary | ICD-10-CM

## 2014-11-26 LAB — POCT URINE PREGNANCY: PREG TEST UR: NEGATIVE

## 2014-11-26 NOTE — Patient Instructions (Signed)

## 2014-11-26 NOTE — Progress Notes (Signed)
Patient ID: Caroline Woods, female   DOB: 26-Feb-1991, 24 y.o.   MRN: [id number] Patient given informed consent, signed copy in the chart, time out was performed.  Placed in lithotomy position. Cervix viewed with speculum and colposcope after application of acetic acid.  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  yes Acetowhite lesions?yes Punctation?no Mosaicism?  no Abnormal vasculature?  no Biopsies?yes ECC?no  Patient was given post procedure instructions.  We will contact her with the results.  Suspect that 1 year f/u will be the result.   Zackery Brine L. Harraway-Smith, M.D., [place] [place]

## 2014-11-28 ENCOUNTER — Telehealth: Payer: Self-pay | Admitting: *Deleted

## 2014-11-28 NOTE — Telephone Encounter (Signed)
-----   Message from Willodean Rosenthal, MD sent at 11/28/2014 10:40 AM EDT ----- Please call pt. Her colpo bx confirmed low grade dysplasia.  She needs cotesting in 1 year.  Thx, clh-S

## 2014-11-28 NOTE — Telephone Encounter (Signed)
Spoke to pt about colpo result and the recommendation to follow-up in one year.  Pt acknowledged instruction.

## 2014-12-15 ENCOUNTER — Encounter: Payer: Self-pay | Admitting: *Deleted

## 2015-09-14 ENCOUNTER — Telehealth: Payer: Self-pay | Admitting: *Deleted

## 2015-09-14 DIAGNOSIS — Z3041 Encounter for surveillance of contraceptive pills: Secondary | ICD-10-CM

## 2015-09-14 MED ORDER — NORGESTIM-ETH ESTRAD TRIPHASIC 0.18/0.215/0.25 MG-35 MCG PO TABS: 1.0000 | ORAL_TABLET | Freq: Every day | ORAL | Status: DC

## 2015-09-14 NOTE — Telephone Encounter (Signed)
-----   Message from Olevia BowensJacinda S Battle sent at 09/14/2015  2:06 PM EDT ----- Regarding: Refill Request Contact: [phone] Needs a refill on OCP [place]ses [facility] [place]

## 2015-11-17 ENCOUNTER — Ambulatory Visit: Payer: Self-pay | Admitting: Obstetrics & Gynecology

## 2015-12-02 ENCOUNTER — Ambulatory Visit: Payer: Self-pay | Admitting: Family Medicine

## 2015-12-02 ENCOUNTER — Ambulatory Visit (INDEPENDENT_AMBULATORY_CARE_PROVIDER_SITE_OTHER): Payer: BLUE CROSS/BLUE SHIELD | Admitting: Family Medicine

## 2015-12-02 ENCOUNTER — Encounter: Payer: Self-pay | Admitting: Family Medicine

## 2015-12-02 VITALS — BP 130/85 | HR 103 | Ht 61.0 in | Wt 190.0 lb

## 2015-12-02 DIAGNOSIS — Z3041 Encounter for surveillance of contraceptive pills: Secondary | ICD-10-CM

## 2015-12-02 DIAGNOSIS — Z124 Encounter for screening for malignant neoplasm of cervix: Secondary | ICD-10-CM | POA: Diagnosis not present

## 2015-12-02 DIAGNOSIS — N870 Mild cervical dysplasia: Secondary | ICD-10-CM

## 2015-12-02 DIAGNOSIS — Z01419 Encounter for gynecological examination (general) (routine) without abnormal findings: Secondary | ICD-10-CM | POA: Diagnosis not present

## 2015-12-02 DIAGNOSIS — Z1151 Encounter for screening for human papillomavirus (HPV): Secondary | ICD-10-CM

## 2015-12-02 MED ORDER — NORGESTIM-ETH ESTRAD TRIPHASIC 0.18/0.215/0.25 MG-35 MCG PO TABS: 1.0000 | ORAL_TABLET | Freq: Every day | ORAL | 12 refills | Status: DC

## 2015-12-02 MED ORDER — NORGESTIM-ETH ESTRAD TRIPHASIC 0.18/0.215/0.25 MG-35 MCG PO TABS: 1.0000 | ORAL_TABLET | Freq: Every day | ORAL | 4 refills | Status: DC

## 2015-12-02 NOTE — Progress Notes (Signed)
   CLINIC ENCOUNTER NOTE  History:  [age] y.o. No obstetric history on file. here today for annual exam. She denies any abnormal vaginal discharge, bleeding, pelvic pain or other concerns.   Past Medical History:  Diagnosis Date  . ADHD (attention deficit hyperactivity disorder)   . Childhood asthma   . 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 Vaginal Pap smear, abnormal 2014   Unknown result    No past surgical history on file.  The following portions of the patient's history were reviewed and updated as appropriate: allergies, current medications, past family history, past medical history, past social history, past surgical history and problem list.   Health Maintenance:   Pap 10/29/2014-- LSIL with HPV pos Colpo 11/26/2014- CIN I, mild dysplasia  Review of Systems:  Pertinent items noted in HPI and remainder of comprehensive ROS otherwise negative.  Objective:  Physical Exam BP 130/85   Pulse (!) 103   Ht 5\' 1"  (1.549 m)   Wt 190 lb (86.2 kg)   LMP 11/10/2015   BMI 35.90 kg/m  CONSTITUTIONAL: Well-developed, well-nourished female in no acute distress.  HENT:  Normocephalic, atraumatic. External right and left ear normal. Oropharynx is clear and moist EYES: Conjunctivae and EOM are normal. Pupils are equal, round, and reactive to light. No scleral icterus.  NECK: Normal range of motion, supple, no masses SKIN: Skin is warm and dry. No rash noted. Not diaphoretic. No erythema. No pallor. NEUROLGIC: Alert and oriented to person, place, and time. Normal reflexes, muscle tone coordination. No cranial nerve deficit noted. PSYCHIATRIC: Normal mood and affect. Normal behavior. Normal judgment and thought content. CARDIOVASCULAR: Normal heart rate noted RESPIRATORY: Effort and breath sounds normal, no problems with respiration noted ABDOMEN: Soft, no distention noted.   PELVIC:  Normal appearing external genitalia; 0.5 mm hyperpigmented lesion on right labia. Non-raised and no abnormal coloring. normal appearing vaginal mucosa and cervix. Retroverted uterus, used medium gray's speculum. White discharge noted.  Normal uterine size, no other palpable masses, no uterine or adnexal tenderness. MUSCULOSKELETAL: Normal range of motion. No edema noted.  Labs and Imaging No results found.  Assessment & Plan:   1. Uses oral contraception - Norgestimate-Ethinyl Estradiol Triphasic (ORTHO TRI-CYCLEN, 28,) 0.18/0.215/0.25 MG-35 MCG tablet; Take 1 tablet by mouth daily.  Dispense: 3 Package; Refill: 4  2. Dysplasia of cervix, low grade (CIN 1) - Pap IG and HPV (high risk) DNA detection ([facility])  3. Well woman exam with routine gynecological exam - HIV antibody (with reflex) - RPR - Discussed sun safety  - Recommended following right labia lesion for change over time. Consider bx if enlarging.    Routine preventative health maintenance measures emphasized. Please refer to After Visit Summary for other counseling recommendations.   Return in 1 year (on 12/01/2016) for Routine care.

## 2015-12-03 LAB — RPR

## 2015-12-03 LAB — GC/CHLAMYDIA PROBE AMP
CT Probe RNA: NOT DETECTED
GC Probe RNA: NOT DETECTED

## 2015-12-03 LAB — HIV ANTIBODY (ROUTINE TESTING W REFLEX): HIV: NONREACTIVE

## 2015-12-03 LAB — CYTOLOGY - PAP

## 2015-12-09 ENCOUNTER — Telehealth: Payer: Self-pay | Admitting: *Deleted

## 2015-12-09 NOTE — Telephone Encounter (Signed)
Informed pt of result and recommendation to follow-up in one year for repeat pap smear, pt acknowledged instructions.

## 2015-12-09 NOTE — Telephone Encounter (Signed)
-----   Message from Federico FlakeKimberly Niles Newton, MD sent at 12/09/2015 10:11 AM EDT ----- Please call and let patient know STI testing negative. Her Pap showed she is resolving the abnormal cells and it is overall improving. She still has the virus that can lead to cervical cancer and will need another pap in 1 yr.   Thanks,  Dr. NDorris Carnes

## 2016-11-30 ENCOUNTER — Encounter: Payer: Self-pay | Admitting: Radiology

## 2017-02-12 ENCOUNTER — Other Ambulatory Visit: Payer: Self-pay | Admitting: Family Medicine

## 2017-02-12 DIAGNOSIS — Z3041 Encounter for surveillance of contraceptive pills: Secondary | ICD-10-CM

## 2017-04-17 ENCOUNTER — Ambulatory Visit: Payer: BLUE CROSS/BLUE SHIELD | Admitting: Obstetrics & Gynecology

## 2017-04-19 ENCOUNTER — Ambulatory Visit: Payer: BLUE CROSS/BLUE SHIELD | Admitting: Family Medicine

## 2017-04-19 ENCOUNTER — Ambulatory Visit (INDEPENDENT_AMBULATORY_CARE_PROVIDER_SITE_OTHER): Payer: BLUE CROSS/BLUE SHIELD | Admitting: Family Medicine

## 2017-04-19 ENCOUNTER — Ambulatory Visit: Payer: Self-pay | Admitting: Family Medicine

## 2017-04-19 ENCOUNTER — Encounter: Payer: Self-pay | Admitting: Family Medicine

## 2017-04-19 DIAGNOSIS — O0930 Supervision of pregnancy with insufficient antenatal care, unspecified trimester: Secondary | ICD-10-CM

## 2017-04-19 DIAGNOSIS — O0932 Supervision of pregnancy with insufficient antenatal care, second trimester: Secondary | ICD-10-CM

## 2017-04-19 DIAGNOSIS — Z113 Encounter for screening for infections with a predominantly sexual mode of transmission: Secondary | ICD-10-CM | POA: Diagnosis not present

## 2017-04-19 DIAGNOSIS — O99212 Obesity complicating pregnancy, second trimester: Secondary | ICD-10-CM

## 2017-04-19 DIAGNOSIS — E669 Obesity, unspecified: Secondary | ICD-10-CM | POA: Insufficient documentation

## 2017-04-19 DIAGNOSIS — Z124 Encounter for screening for malignant neoplasm of cervix: Secondary | ICD-10-CM | POA: Diagnosis not present

## 2017-04-19 DIAGNOSIS — R030 Elevated blood-pressure reading, without diagnosis of hypertension: Secondary | ICD-10-CM

## 2017-04-19 DIAGNOSIS — Z3480 Encounter for supervision of other normal pregnancy, unspecified trimester: Secondary | ICD-10-CM | POA: Insufficient documentation

## 2017-04-19 DIAGNOSIS — O99210 Obesity complicating pregnancy, unspecified trimester: Secondary | ICD-10-CM | POA: Insufficient documentation

## 2017-04-19 NOTE — Progress Notes (Signed)
   PRENATAL VISIT NOTE  Subjective:  Caroline Woods is a 27 y.o. G1P0000 at 5490w3d being seen today for ongoing prenatal care.  She is currently monitored for the following issues for this low-risk pregnancy and has Low grade squamous intraepithelial lesion (LGSIL) on cervical Pap smear; Dysplasia of cervix, low grade (CIN 1); Late prenatal care affecting pregnancy; Supervision of other normal pregnancy, antepartum; Obesity affecting pregnancy, antepartum; Obesity, Class II, BMI 35-39.9; and Elevated BP without diagnosis of hypertension on their problem list.  Patient reports no complaints.   . Vag. Bleeding: None.   . Denies leaking of fluid.   The following portions of the patient's history were reviewed and updated as appropriate: allergies, current medications, past family history, past medical history, past social history, past surgical history and problem list. Problem list updated.  Objective:   Vitals:   04/19/17 1054  BP: (!) 154/107  Pulse: (!) 130  Weight: 200 lb (90.7 kg)    Fetal Status: Fetal Heart Rate (bpm): 152 Fundal Height: 24 cm       General:  Alert, oriented and cooperative. Patient is in no acute distress.  Skin: Skin is warm and dry. No rash noted.   Cardiovascular: Normal heart rate noted  Respiratory: Normal respiratory effort, no problems with respiration noted  Abdomen: Soft, gravid, appropriate for gestational age.        Pelvic: Cervical exam performed        Extremities: Normal range of motion.     Mental Status:  Normal mood and affect. Normal behavior. Normal judgment and thought content.   Assessment and Plan:  Pregnancy: G1P0000 at 5390w3d  1. Late prenatal care affecting pregnancy, antepartum Was taking OCP irregularly to prevent pregnancy, shocked by pregnancy and "baby daddy" not invovled  2. Supervision of other normal pregnancy, antepartum - Reviewed group practice setting - Reviewed medications-- taking vyvanse about 3 times per week.  Discussed reduction to lowest effective dose as the patient is in school and needs for focus in class.  - Cytology - PAP - Obstetric Panel, Including HIV - Culture, OB Urine - Genetic Screening - Hemoglobinopathy evaluation - Cystic Fibrosis Mutation 97 - SMN1 COPY NUMBER ANALYSIS (SMA Carrier Screen) - GC/Chlamydia probe amp [facility] - [facility] bedside; Future  Preterm labor symptoms and general obstetric precautions including but not limited to vaginal bleeding, contractions, leaking of fluid and fetal movement were reviewed in detail with the patient. Please refer to After Visit Summary for other counseling recommendations.   Return in about 4 weeks (around 05/17/2017) for Routine prenatal care.   Federico FlakeKimberly Niles Zela Sobieski, MD

## 2017-04-19 NOTE — Progress Notes (Signed)
DATING AND VIABILITY SONOGRAM   Caroline BertholdChelsea Woods is a 27 y.o. year old G1P0000 with LMP Patient's last menstrual period was 12/25/2016. which would correlate to  3913w3d weeks gestation.  She has irregular menstrual cycles.   She is here today for a confirmatory initial sonogram.    GESTATION: [place]     FETAL ACTIVITY:          Heart rate: 152bpm          The fetus is active.   GESTATIONAL AGE AND  BIOMETRICS:  Gestational criteria: Estimated Date of Delivery: 08/06/17 by midtrimester ultrasound now at 6913w3d  Previous Scans:0 FL    HC                                                     AVERAGE EGA(BY THIS SCAN):  24.3 weeks  WORKING EDD( midtrimester ultrasound ):  08/06/17     TECHNICIAN COMMENTS:  SLIUP measuring 24.3wks by FL/HC with FHR 156bpm   A copy of this report including all images has been saved and backed up to a second source for retrieval if needed. All measures and details of the anatomical scan, placentation, fluid volume and pelvic anatomy are contained in that report.  Caroline Woods 04/19/2017 11:20 AM

## 2017-04-20 ENCOUNTER — Encounter (HOSPITAL_COMMUNITY): Payer: Self-pay | Admitting: Family Medicine

## 2017-04-20 LAB — OBSTETRIC PANEL, INCLUDING HIV
Antibody Screen: NEGATIVE
BASOS ABS: 0 10*3/uL (ref 0.0–0.2)
Basos: 0 %
EOS (ABSOLUTE): 0.1 10*3/uL (ref 0.0–0.4)
Eos: 1 %
HIV SCREEN 4TH GENERATION: NONREACTIVE
Hematocrit: 35.9 % (ref 34.0–46.6)
Hemoglobin: 11.7 g/dL (ref 11.1–15.9)
Hepatitis B Surface Ag: NEGATIVE
Immature Grans (Abs): 0 10*3/uL (ref 0.0–0.1)
Immature Granulocytes: 0 %
LYMPHS ABS: 1.9 10*3/uL (ref 0.7–3.1)
Lymphs: 17 %
MCH: 30.9 pg (ref 26.6–33.0)
MCHC: 32.6 g/dL (ref 31.5–35.7)
MCV: 95 fL (ref 79–97)
MONOS ABS: 0.7 10*3/uL (ref 0.1–0.9)
Monocytes: 7 %
NEUTROS ABS: 8.1 10*3/uL — AB (ref 1.4–7.0)
Neutrophils: 75 %
PLATELETS: 435 10*3/uL — AB (ref 150–379)
RBC: 3.79 x10E6/uL (ref 3.77–5.28)
RDW: 13.6 % (ref 12.3–15.4)
RPR Ser Ql: NONREACTIVE
Rh Factor: POSITIVE
Rubella Antibodies, IGG: 2.13 index (ref >0.99)
WBC: 10.8 10*3/uL (ref 3.4–10.8)

## 2017-04-20 LAB — GC/CHLAMYDIA PROBE AMP (~~LOC~~) NOT AT ARMC
Chlamydia: NEGATIVE
Neisseria Gonorrhea: NEGATIVE

## 2017-04-21 LAB — CYTOLOGY - PAP
ADEQUACY: ABSENT
DIAGNOSIS: NEGATIVE

## 2017-04-21 LAB — URINE CULTURE, OB REFLEX

## 2017-04-21 LAB — CULTURE, OB URINE

## 2017-04-26 ENCOUNTER — Encounter: Payer: Self-pay | Admitting: *Deleted

## 2017-04-26 LAB — SMN1 COPY NUMBER ANALYSIS (SMA CARRIER SCREENING)

## 2017-04-26 LAB — CYSTIC FIBROSIS MUTATION 97: Interpretation: NOT DETECTED

## 2017-04-26 LAB — HEMOGLOBINOPATHY EVALUATION
HEMOGLOBIN F QUANTITATION: 0.6 % (ref 0.0–2.0)
HGB A: 97.2 % (ref 96.4–98.8)
HGB C: 0 %
HGB S: 0 %
HGB VARIANT: 0 %
Hemoglobin A2 Quantitation: 2.2 % (ref 1.8–3.2)

## 2017-04-27 ENCOUNTER — Encounter (HOSPITAL_COMMUNITY): Payer: Self-pay

## 2017-04-27 ENCOUNTER — Ambulatory Visit (HOSPITAL_COMMUNITY)
Admission: RE | Admit: 2017-04-27 | Discharge: 2017-04-27 | Disposition: A | Discharge status: Home / Self Care | Payer: BLUE CROSS/BLUE SHIELD | Source: Ambulatory Visit | Attending: Family Medicine | Admitting: Family Medicine

## 2017-04-27 ENCOUNTER — Other Ambulatory Visit: Payer: Self-pay | Admitting: Family Medicine

## 2017-04-27 ENCOUNTER — Other Ambulatory Visit (HOSPITAL_COMMUNITY): Payer: Self-pay | Admitting: *Deleted

## 2017-04-27 DIAGNOSIS — O99210 Obesity complicating pregnancy, unspecified trimester: Secondary | ICD-10-CM

## 2017-04-27 DIAGNOSIS — R030 Elevated blood-pressure reading, without diagnosis of hypertension: Secondary | ICD-10-CM

## 2017-04-27 DIAGNOSIS — E66812 Obesity, class 2: Secondary | ICD-10-CM

## 2017-04-27 DIAGNOSIS — E669 Obesity, unspecified: Secondary | ICD-10-CM

## 2017-04-27 DIAGNOSIS — O0930 Supervision of pregnancy with insufficient antenatal care, unspecified trimester: Secondary | ICD-10-CM

## 2017-04-27 DIAGNOSIS — Z3480 Encounter for supervision of other normal pregnancy, unspecified trimester: Secondary | ICD-10-CM

## 2017-04-27 DIAGNOSIS — Z3A28 28 weeks gestation of pregnancy: Secondary | ICD-10-CM | POA: Insufficient documentation

## 2017-04-27 DIAGNOSIS — Z3A25 25 weeks gestation of pregnancy: Secondary | ICD-10-CM

## 2017-04-27 DIAGNOSIS — Z363 Encounter for antenatal screening for malformations: Secondary | ICD-10-CM

## 2017-04-27 DIAGNOSIS — Z362 Encounter for other antenatal screening follow-up: Secondary | ICD-10-CM

## 2017-05-18 ENCOUNTER — Ambulatory Visit (INDEPENDENT_AMBULATORY_CARE_PROVIDER_SITE_OTHER): Payer: BLUE CROSS/BLUE SHIELD | Admitting: Family Medicine

## 2017-05-18 DIAGNOSIS — Z3480 Encounter for supervision of other normal pregnancy, unspecified trimester: Secondary | ICD-10-CM

## 2017-05-18 DIAGNOSIS — Z23 Encounter for immunization: Secondary | ICD-10-CM

## 2017-05-18 DIAGNOSIS — Z3483 Encounter for supervision of other normal pregnancy, third trimester: Secondary | ICD-10-CM

## 2017-05-18 NOTE — Patient Instructions (Signed)
Third Trimester of Pregnancy The third trimester is from week 28 through week 40 (months 7 through 9). The third trimester is a time when the unborn baby (fetus) is growing rapidly. At the end of the ninth month, the fetus is about 20 inches in length and weighs 6-10 pounds. Body changes during your third trimester Your body will continue to go through many changes during pregnancy. The changes vary from woman to woman. During the third trimester:  Your weight will continue to increase. You can expect to gain 25-35 pounds (11-16 kg) by the end of the pregnancy.  You may begin to get stretch marks on your hips, abdomen, and breasts.  You may urinate more often because the fetus is moving lower into your pelvis and pressing on your bladder.  You may develop or continue to have heartburn. This is caused by increased hormones that slow down muscles in the digestive tract.  You may develop or continue to have constipation because increased hormones slow digestion and cause the muscles that push waste through your intestines to relax.  You may develop hemorrhoids. These are swollen veins (varicose veins) in the rectum that can itch or be painful.  You may develop swollen, bulging veins (varicose veins) in your legs.  You may have increased body aches in the pelvis, back, or thighs. This is due to weight gain and increased hormones that are relaxing your joints.  You may have changes in your hair. These can include thickening of your hair, rapid growth, and changes in texture. Some women also have hair loss during or after pregnancy, or hair that feels dry or thin. Your hair will most likely return to normal after your baby is born.  Your breasts will continue to grow and they will continue to become tender. A yellow fluid (colostrum) may leak from your breasts. This is the first milk you are producing for your baby.  Your belly button may stick out.  You may notice more swelling in your hands,  face, or ankles.  You may have increased tingling or numbness in your hands, arms, and legs. The skin on your belly may also feel numb.  You may feel short of breath because of your expanding uterus.  You may have more problems sleeping. This can be caused by the size of your belly, increased need to urinate, and an increase in your body's metabolism.  You may notice the fetus "dropping," or moving lower in your abdomen (lightening).  You may have increased vaginal discharge.  You may notice your joints feel loose and you may have pain around your pelvic bone.  What to expect at prenatal visits You will have prenatal exams every 2 weeks until week 36. Then you will have weekly prenatal exams. During a routine prenatal visit:  You will be weighed to make sure you and the baby are growing normally.  Your blood pressure will be taken.  Your abdomen will be measured to track your baby's growth.  The fetal heartbeat will be listened to.  Any test results from the previous visit will be discussed.  You may have a cervical check near your due date to see if your cervix has softened or thinned (effaced).  You will be tested for Group B streptococcus. This happens between 35 and 37 weeks.  Your health care provider may ask you:  What your birth plan is.  How you are feeling.  If you are feeling the baby move.  If you have had   any abnormal symptoms, such as leaking fluid, bleeding, severe headaches, or abdominal cramping.  If you are using any tobacco products, including cigarettes, chewing tobacco, and electronic cigarettes.  If you have any questions.  Other tests or screenings that may be performed during your third trimester include:  Blood tests that check for low iron levels (anemia).  Fetal testing to check the health, activity level, and growth of the fetus. Testing is done if you have certain medical conditions or if there are problems during the  pregnancy.  Nonstress test (NST). This test checks the health of your baby to make sure there are no signs of problems, such as the baby not getting enough oxygen. During this test, a belt is placed around your belly. The baby is made to move, and its heart rate is monitored during movement.  What is false labor? False labor is a condition in which you feel small, irregular tightenings of the muscles in the womb (contractions) that usually go away with rest, changing position, or drinking water. These are called Braxton Hicks contractions. Contractions may last for hours, days, or even weeks before true labor sets in. If contractions come at regular intervals, become more frequent, increase in intensity, or become painful, you should see your health care provider. What are the signs of labor?  Abdominal cramps.  Regular contractions that start at 10 minutes apart and become stronger and more frequent with time.  Contractions that start on the top of the uterus and spread down to the lower abdomen and back.  Increased pelvic pressure and dull back pain.  A watery or bloody mucus discharge that comes from the vagina.  Leaking of amniotic fluid. This is also known as your "water breaking." It could be a slow trickle or a gush. Let your health care provider know if it has a color or strange odor. If you have any of these signs, call your health care provider right away, even if it is before your due date. Follow these instructions at home: Medicines  Follow your health care provider's instructions regarding medicine use. Specific medicines may be either safe or unsafe to take during pregnancy.  Take a prenatal vitamin that contains at least 600 micrograms (mcg) of folic acid.  If you develop constipation, try taking a stool softener if your health care provider approves. Eating and drinking  Eat a balanced diet that includes fresh fruits and vegetables, whole grains, good sources of protein  such as meat, eggs, or tofu, and low-fat dairy. Your health care provider will help you determine the amount of weight gain that is right for you.  Avoid raw meat and uncooked cheese. These carry germs that can cause birth defects in the baby.  If you have low calcium intake from food, talk to your health care provider about whether you should take a daily calcium supplement.  Eat four or five small meals rather than three large meals a day.  Limit foods that are high in fat and processed sugars, such as fried and sweet foods.  To prevent constipation: ? Drink enough fluid to keep your urine clear or pale yellow. ? Eat foods that are high in fiber, such as fresh fruits and vegetables, whole grains, and beans. Activity  Exercise only as directed by your health care provider. Most women can continue their usual exercise routine during pregnancy. Try to exercise for 30 minutes at least 5 days a week. Stop exercising if you experience uterine contractions.  Avoid heavy   lifting.  Do not exercise in extreme heat or humidity, or at high altitudes.  Wear low-heel, comfortable shoes.  Practice good posture.  You may continue to have sex unless your health care provider tells you otherwise. Relieving pain and discomfort  Take frequent breaks and rest with your legs elevated if you have leg cramps or low back pain.  Take warm sitz baths to soothe any pain or discomfort caused by hemorrhoids. Use hemorrhoid cream if your health care provider approves.  Wear a good support bra to prevent discomfort from breast tenderness.  If you develop varicose veins: ? Wear support pantyhose or compression stockings as told by your healthcare provider. ? Elevate your feet for 15 minutes, 3-4 times a day. Prenatal care  Write down your questions. Take them to your prenatal visits.  Keep all your prenatal visits as told by your health care provider. This is important. Safety  Wear your seat belt at  all times when driving.  Make a list of emergency phone numbers, including numbers for family, friends, the hospital, and police and fire departments. General instructions  Avoid cat litter boxes and soil used by cats. These carry germs that can cause birth defects in the baby. If you have a cat, ask someone to clean the litter box for you.  Do not travel far distances unless it is absolutely necessary and only with the approval of your health care provider.  Do not use hot tubs, steam rooms, or saunas.  Do not drink alcohol.  Do not use any products that contain nicotine or tobacco, such as cigarettes and e-cigarettes. If you need help quitting, ask your health care provider.  Do not use any medicinal herbs or unprescribed drugs. These chemicals affect the formation and growth of the baby.  Do not douche or use tampons or scented sanitary pads.  Do not cross your legs for long periods of time.  To prepare for the arrival of your baby: ? Take prenatal classes to understand, practice, and ask questions about labor and delivery. ? Make a trial run to the hospital. ? Visit the hospital and tour the maternity area. ? Arrange for maternity or paternity leave through employers. ? Arrange for family and friends to take care of pets while you are in the hospital. ? Purchase a rear-facing car seat and make sure you know how to install it in your car. ? Pack your hospital bag. ? Prepare the baby's nursery. Make sure to remove all pillows and stuffed animals from the baby's crib to prevent suffocation.  Visit your dentist if you have not gone during your pregnancy. Use a soft toothbrush to brush your teeth and be gentle when you floss. Contact a health care provider if:  You are unsure if you are in labor or if your water has broken.  You become dizzy.  You have mild pelvic cramps, pelvic pressure, or nagging pain in your abdominal area.  You have lower back pain.  You have persistent  nausea, vomiting, or diarrhea.  You have an unusual or bad smelling vaginal discharge.  You have pain when you urinate. Get help right away if:  Your water breaks before 37 weeks.  You have regular contractions less than 5 minutes apart before 37 weeks.  You have a fever.  You are leaking fluid from your vagina.  You have spotting or bleeding from your vagina.  You have severe abdominal pain or cramping.  You have rapid weight loss or weight gain.    You have shortness of breath with chest pain.  You notice sudden or extreme swelling of your face, hands, ankles, feet, or legs.  Your baby makes fewer than 10 movements in 2 hours.  You have severe headaches that do not go away when you take medicine.  You have vision changes. Summary  The third trimester is from week 28 through week 40, months 7 through 9. The third trimester is a time when the unborn baby (fetus) is growing rapidly.  During the third trimester, your discomfort may increase as you and your baby continue to gain weight. You may have abdominal, leg, and back pain, sleeping problems, and an increased need to urinate.  During the third trimester your breasts will keep growing and they will continue to become tender. A yellow fluid (colostrum) may leak from your breasts. This is the first milk you are producing for your baby.  False labor is a condition in which you feel small, irregular tightenings of the muscles in the womb (contractions) that eventually go away. These are called Braxton Hicks contractions. Contractions may last for hours, days, or even weeks before true labor sets in.  Signs of labor can include: abdominal cramps; regular contractions that start at 10 minutes apart and become stronger and more frequent with time; watery or bloody mucus discharge that comes from the vagina; increased pelvic pressure and dull back pain; and leaking of amniotic fluid. This information is not intended to replace advice  given to you by your health care provider. Make sure you discuss any questions you have with your health care provider. Document Released: 03/08/2001 Document Revised: 08/20/2015 Document Reviewed: 05/15/2012 [employer] Interactive Patient Education  2017 [employer]  Breastfeeding Choosing to breastfeed is one of the best decisions you can make for yourself and your baby. A change in hormones during pregnancy causes your breasts to make breast milk in your milk-producing glands. Hormones prevent breast milk from being released before your baby is born. They also prompt milk flow after birth. Once breastfeeding has begun, thoughts of your baby, as well as his or her sucking or crying, can stimulate the release of milk from your milk-producing glands. Benefits of breastfeeding Research shows that breastfeeding offers many health benefits for infants and mothers. It also offers a cost-free and convenient way to feed your baby. For your baby  Your first milk (colostrum) helps your baby's digestive system to function better.  Special cells in your milk (antibodies) help your baby to fight off infections.  Breastfed babies are less likely to develop asthma, allergies, obesity, or type 2 diabetes. They are also at lower risk for sudden infant death syndrome (SIDS).  Nutrients in breast milk are better able to meet your baby's needs compared to infant formula.  Breast milk improves your baby's brain development. For you  Breastfeeding helps to create a very special bond between you and your baby.  Breastfeeding is convenient. Breast milk costs nothing and is always available at the correct temperature.  Breastfeeding helps to burn calories. It helps you to lose the weight that you gained during pregnancy.  Breastfeeding makes your uterus return faster to its size before pregnancy. It also slows bleeding (lochia) after you give birth.  Breastfeeding helps to lower your risk of developing type 2  diabetes, osteoporosis, rheumatoid arthritis, cardiovascular disease, and breast, ovarian, uterine, and endometrial cancer later in life. Breastfeeding basics Starting breastfeeding  Find a comfortable place to sit or lie down, with your neck and back   well-supported.  Place a pillow or a rolled-up blanket under your baby to bring him or her to the level of your breast (if you are seated). Nursing pillows are specially designed to help support your arms and your baby while you breastfeed.  Make sure that your baby's tummy (abdomen) is facing your abdomen.  Gently massage your breast. With your fingertips, massage from the outer edges of your breast inward toward the nipple. This encourages milk flow. If your milk flows slowly, you may need to continue this action during the feeding.  Support your breast with 4 fingers underneath and your thumb above your nipple (make the letter "C" with your hand). Make sure your fingers are well away from your nipple and your baby's mouth.  Stroke your baby's lips gently with your finger or nipple.  When your baby's mouth is open wide enough, quickly bring your baby to your breast, placing your entire nipple and as much of the areola as possible into your baby's mouth. The areola is the colored area around your nipple. ? More areola should be visible above your baby's upper lip than below the lower lip. ? Your baby's lips should be opened and extended outward (flanged) to ensure an adequate, comfortable latch. ? Your baby's tongue should be between his or her lower gum and your breast.  Make sure that your baby's mouth is correctly positioned around your nipple (latched). Your baby's lips should create a seal on your breast and be turned out (everted).  It is common for your baby to suck about 2-3 minutes in order to start the flow of breast milk. Latching Teaching your baby how to latch onto your breast properly is very important. An improper latch can  cause nipple pain, decreased milk supply, and poor weight gain in your baby. Also, if your baby is not latched onto your nipple properly, he or she may swallow some air during feeding. This can make your baby fussy. Burping your baby when you switch breasts during the feeding can help to get rid of the air. However, teaching your baby to latch on properly is still the best way to prevent fussiness from swallowing air while breastfeeding. Signs that your baby has successfully latched onto your nipple  Silent tugging or silent sucking, without causing you pain. Infant's lips should be extended outward (flanged).  Swallowing heard between every 3-4 sucks once your milk has started to flow (after your let-down milk reflex occurs).  Muscle movement above and in front of his or her ears while sucking.  Signs that your baby has not successfully latched onto your nipple  Sucking sounds or smacking sounds from your baby while breastfeeding.  Nipple pain.  If you think your baby has not latched on correctly, slip your finger into the corner of your baby's mouth to break the suction and place it between your baby's gums. Attempt to start breastfeeding again. Signs of successful breastfeeding Signs from your baby  Your baby will gradually decrease the number of sucks or will completely stop sucking.  Your baby will fall asleep.  Your baby's body will relax.  Your baby will retain a small amount of milk in his or her mouth.  Your baby will let go of your breast by himself or herself.  Signs from you  Breasts that have increased in firmness, weight, and size 1-3 hours after feeding.  Breasts that are softer immediately after breastfeeding.  Increased milk volume, as well as a change in milk   consistency and color by the fifth day of breastfeeding.  Nipples that are not sore, cracked, or bleeding.  Signs that your baby is getting enough milk  Wetting at least 1-2 diapers during the first 24  hours after birth.  Wetting at least 5-6 diapers every 24 hours for the first week after birth. The urine should be clear or pale yellow by the age of 5 days.  Wetting 6-8 diapers every 24 hours as your baby continues to grow and develop.  At least 3 stools in a 24-hour period by the age of 5 days. The stool should be soft and yellow.  At least 3 stools in a 24-hour period by the age of 7 days. The stool should be seedy and yellow.  No loss of weight greater than 10% of birth weight during the first 3 days of life.  Average weight gain of 4-7 oz (113-198 g) per week after the age of 4 days.  Consistent daily weight gain by the age of 5 days, without weight loss after the age of 2 weeks. After a feeding, your baby may spit up a small amount of milk. This is normal. Breastfeeding frequency and duration Frequent feeding will help you make more milk and can prevent sore nipples and extremely full breasts (breast engorgement). Breastfeed when you feel the need to reduce the fullness of your breasts or when your baby shows signs of hunger. This is called "breastfeeding on demand." Signs that your baby is hungry include:  Increased alertness, activity, or restlessness.  Movement of the head from side to side.  Opening of the mouth when the corner of the mouth or cheek is stroked (rooting).  Increased sucking sounds, smacking lips, cooing, sighing, or squeaking.  Hand-to-mouth movements and sucking on fingers or hands.  Fussing or crying.  Avoid introducing a pacifier to your baby in the first 4-6 weeks after your baby is born. After this time, you may choose to use a pacifier. Research has shown that pacifier use during the first year of a baby's life decreases the risk of sudden infant death syndrome (SIDS). Allow your baby to feed on each breast as long as he or she wants. When your baby unlatches or falls asleep while feeding from the first breast, offer the second breast. Because  newborns are often sleepy in the first few weeks of life, you may need to awaken your baby to get him or her to feed. Breastfeeding times will vary from baby to baby. However, the following rules can serve as a guide to help you make sure that your baby is properly fed:  Newborns (babies 4 weeks of age or younger) may breastfeed every 1-3 hours.  Newborns should not go without breastfeeding for longer than 3 hours during the day or 5 hours during the night.  You should breastfeed your baby a minimum of 8 times in a 24-hour period.  Breast milk pumping Pumping and storing breast milk allows you to make sure that your baby is exclusively fed your breast milk, even at times when you are unable to breastfeed. This is especially important if you go back to work while you are still breastfeeding, or if you are not able to be present during feedings. Your lactation consultant can help you find a method of pumping that works best for you and give you guidelines about how long it is safe to store breast milk. Caring for your breasts while you breastfeed Nipples can become dry, cracked, and   sore while breastfeeding. The following recommendations can help keep your breasts moisturized and healthy:  Avoid using soap on your nipples.  Wear a supportive bra designed especially for nursing. Avoid wearing underwire-style bras or extremely tight bras (sports bras).  Air-dry your nipples for 3-4 minutes after each feeding.  Use only cotton bra pads to absorb leaked breast milk. Leaking of breast milk between feedings is normal.  Use lanolin on your nipples after breastfeeding. Lanolin helps to maintain your skin's normal moisture barrier. Pure lanolin is not harmful (not toxic) to your baby. You may also hand express a few drops of breast milk and gently massage that milk into your nipples and allow the milk to air-dry.  In the first few weeks after giving birth, some women experience breast engorgement.  Engorgement can make your breasts feel heavy, warm, and tender to the touch. Engorgement peaks within 3-5 days after you give birth. The following recommendations can help to ease engorgement:  Completely empty your breasts while breastfeeding or pumping. You may want to start by applying warm, moist heat (in the shower or with warm, water-soaked hand towels) just before feeding or pumping. This increases circulation and helps the milk flow. If your baby does not completely empty your breasts while breastfeeding, pump any extra milk after he or she is finished.  Apply ice packs to your breasts immediately after breastfeeding or pumping, unless this is too uncomfortable for you. To do this: ? Put ice in a plastic bag. ? Place a towel between your skin and the bag. ? Leave the ice on for 20 minutes, 2-3 times a day.  Make sure that your baby is latched on and positioned properly while breastfeeding.  If engorgement persists after 48 hours of following these recommendations, contact your health care provider or a lactation consultant. Overall health care recommendations while breastfeeding  Eat 3 healthy meals and 3 snacks every day. Well-nourished mothers who are breastfeeding need an additional 450-500 calories a day. You can meet this requirement by increasing the amount of a balanced diet that you eat.  Drink enough water to keep your urine pale yellow or clear.  Rest often, relax, and continue to take your prenatal vitamins to prevent fatigue, stress, and low vitamin and mineral levels in your body (nutrient deficiencies).  Do not use any products that contain nicotine or tobacco, such as cigarettes and e-cigarettes. Your baby may be harmed by chemicals from cigarettes that pass into breast milk and exposure to secondhand smoke. If you need help quitting, ask your health care provider.  Avoid alcohol.  Do not use illegal drugs or marijuana.  Talk with your health care provider before  taking any medicines. These include over-the-counter and prescription medicines as well as vitamins and herbal supplements. Some medicines that may be harmful to your baby can pass through breast milk.  It is possible to become pregnant while breastfeeding. If birth control is desired, ask your health care provider about options that will be safe while breastfeeding your baby. Where to find more information: [employer]: www.llli.org Contact a health care provider if:  You feel like you want to stop breastfeeding or have become frustrated with breastfeeding.  Your nipples are cracked or bleeding.  Your breasts are red, tender, or warm.  You have: ? Painful breasts or nipples. ? A swollen area on either breast. ? A fever or chills. ? Nausea or vomiting. ? Drainage other than breast milk from your nipples.  Your   breasts do not become full before feedings by the fifth day after you give birth.  You feel sad and depressed.  Your baby is: ? Too sleepy to eat well. ? Having trouble sleeping. ? More than 1 week old and wetting fewer than 6 diapers in a 24-hour period. ? Not gaining weight by 5 days of age.  Your baby has fewer than 3 stools in a 24-hour period.  Your baby's skin or the white parts of his or her eyes become yellow. Get help right away if:  Your baby is overly tired (lethargic) and does not want to wake up and feed.  Your baby develops an unexplained fever. Summary  Breastfeeding offers many health benefits for infant and mothers.  Try to breastfeed your infant when he or she shows early signs of hunger.  Gently tickle or stroke your baby's lips with your finger or nipple to allow the baby to open his or her mouth. Bring the baby to your breast. Make sure that much of the areola is in your baby's mouth. Offer one side and burp the baby before you offer the other side.  Talk with your health care provider or lactation consultant if you have  questions or you face problems as you breastfeed. This information is not intended to replace advice given to you by your health care provider. Make sure you discuss any questions you have with your health care provider. Document Released: 03/14/2005 Document Revised: 04/15/2016 Document Reviewed: 04/15/2016 Elsevier Interactive Patient Education  2018 Elsevier Inc.  

## 2017-05-18 NOTE — Progress Notes (Signed)
   PRENATAL VISIT NOTE  Subjective:  Caroline Woods is a 27 y.o. G1P0000 at [redacted]w[redacted]d being seen today for ongoing prenatal care.  She is currently monitored for the following issues for this low-risk pregnancy and has Low grade squamous intraepithelial lesion (LGSIL) on cervical Pap smear; Late prenatal care affecting pregnancy; Supervision of other normal pregnancy, antepartum; Obesity affecting pregnancy, antepartum; Obesity, Class II, BMI 35-39.9; and Elevated BP without diagnosis of hypertension on their problem list.  Patient reports no complaints.  Contractions: Not present. Vag. Bleeding: None.  Movement: Present. Denies leaking of fluid.   The following portions of the patient's history were reviewed and updated as appropriate: allergies, current medications, past family history, past medical history, past social history, past surgical history and problem list. Problem list updated.  Objective:   Vitals:   05/18/17 1042  BP: 129/84  Pulse: (!) 125  Weight: 206 lb (93.4 kg)    Fetal Status:   Fundal Height: 30 cm Movement: Present     General:  Alert, oriented and cooperative. Patient is in no acute distress.  Skin: Skin is warm and dry. No rash noted.   Cardiovascular: Normal heart rate noted  Respiratory: Normal respiratory effort, no problems with respiration noted  Abdomen: Soft, gravid, appropriate for gestational age.  Pain/Pressure: Absent     Pelvic: Cervical exam deferred        Extremities: Normal range of motion.  Edema: Trace  Mental Status:  Normal mood and affect. Normal behavior. Normal judgment and thought content.   Assessment and Plan:  Pregnancy: G1P0000 at 2724w2d  1. Supervision of other normal pregnancy, antepartum Flu + TDaP update  Needs 2 hour--to come back for tomorrow. - Tdap vaccine greater than or equal to 7yo IM - Flu Vaccine QUAD 36+ mos IM (Fluarix, Quad PF)  Preterm labor symptoms and general obstetric precautions including but not limited to  vaginal bleeding, contractions, leaking of fluid and fetal movement were reviewed in detail with the patient. Please refer to After Visit Summary for other counseling recommendations.  Return in 2 weeks (on 06/01/2017) for 1 day for 2 hour OGT.   Reva Boresanya S Natash Berman, MD

## 2017-05-19 ENCOUNTER — Other Ambulatory Visit: Payer: BLUE CROSS/BLUE SHIELD

## 2017-05-19 DIAGNOSIS — Z3402 Encounter for supervision of normal first pregnancy, second trimester: Secondary | ICD-10-CM

## 2017-05-19 NOTE — Addendum Note (Signed)
Addended by: Dianah FieldBELIZONE, Wen Munford L on: 05/19/2017 08:30 AM   Modules accepted: Orders

## 2017-05-20 LAB — CBC
Hematocrit: 34 % (ref 34.0–46.6)
Hemoglobin: 11.4 g/dL (ref 11.1–15.9)
MCH: 31.1 pg (ref 26.6–33.0)
MCHC: 33.5 g/dL (ref 31.5–35.7)
MCV: 93 fL (ref 79–97)
PLATELETS: 332 10*3/uL (ref 150–379)
RBC: 3.67 x10E6/uL — AB (ref 3.77–5.28)
RDW: 13.8 % (ref 12.3–15.4)
WBC: 10.4 10*3/uL (ref 3.4–10.8)

## 2017-05-20 LAB — HIV ANTIBODY (ROUTINE TESTING W REFLEX): HIV Screen 4th Generation wRfx: NONREACTIVE

## 2017-05-20 LAB — GLUCOSE TOLERANCE, 2 HOURS W/ 1HR
GLUCOSE, 1 HOUR: 113 mg/dL (ref 65–179)
Glucose, 2 hour: 117 mg/dL (ref 65–152)
Glucose, Fasting: 91 mg/dL (ref 65–91)

## 2017-05-20 LAB — RPR: RPR Ser Ql: NONREACTIVE

## 2017-05-23 ENCOUNTER — Other Ambulatory Visit (INDEPENDENT_AMBULATORY_CARE_PROVIDER_SITE_OTHER): Payer: BLUE CROSS/BLUE SHIELD

## 2017-05-23 DIAGNOSIS — N898 Other specified noninflammatory disorders of vagina: Secondary | ICD-10-CM

## 2017-05-23 DIAGNOSIS — Z113 Encounter for screening for infections with a predominantly sexual mode of transmission: Secondary | ICD-10-CM

## 2017-05-23 NOTE — Progress Notes (Signed)
Patient presented to the office today for self swab. Patient reports having some discharge for a couple of days. She thinks she may have a yeast infection. Patient instructed on how to self swab. Specimen collected/labeled and sent to the lab. Patient has been informed we will call he back once result come back.

## 2017-05-24 ENCOUNTER — Other Ambulatory Visit: Payer: Self-pay | Admitting: Family Medicine

## 2017-05-24 DIAGNOSIS — B373 Candidiasis of vulva and vagina: Secondary | ICD-10-CM

## 2017-05-24 DIAGNOSIS — B3731 Acute candidiasis of vulva and vagina: Secondary | ICD-10-CM

## 2017-05-24 LAB — CERVICOVAGINAL ANCILLARY ONLY
Bacterial vaginitis: NEGATIVE
Candida vaginitis: POSITIVE — AB
Trichomonas: NEGATIVE

## 2017-05-24 MED ORDER — TERCONAZOLE 0.8 % VA CREA: 1.0000 | TOPICAL_CREAM | Freq: Every day | VAGINAL | 0 refills | Status: DC

## 2017-05-25 ENCOUNTER — Other Ambulatory Visit (HOSPITAL_COMMUNITY): Payer: Self-pay | Admitting: Maternal and Fetal Medicine

## 2017-05-25 ENCOUNTER — Encounter (HOSPITAL_COMMUNITY): Payer: Self-pay

## 2017-05-25 ENCOUNTER — Ambulatory Visit (HOSPITAL_COMMUNITY)
Admission: RE | Admit: 2017-05-25 | Discharge: 2017-05-25 | Disposition: A | Discharge status: Home / Self Care | Payer: BLUE CROSS/BLUE SHIELD | Source: Ambulatory Visit | Attending: Family Medicine | Admitting: Family Medicine

## 2017-05-25 DIAGNOSIS — Z3A32 32 weeks gestation of pregnancy: Secondary | ICD-10-CM | POA: Diagnosis not present

## 2017-05-25 DIAGNOSIS — O99213 Obesity complicating pregnancy, third trimester: Secondary | ICD-10-CM

## 2017-05-25 DIAGNOSIS — Z362 Encounter for other antenatal screening follow-up: Secondary | ICD-10-CM | POA: Diagnosis not present

## 2017-05-25 DIAGNOSIS — O0930 Supervision of pregnancy with insufficient antenatal care, unspecified trimester: Secondary | ICD-10-CM

## 2017-05-25 DIAGNOSIS — O0933 Supervision of pregnancy with insufficient antenatal care, third trimester: Secondary | ICD-10-CM

## 2017-05-25 DIAGNOSIS — Z3480 Encounter for supervision of other normal pregnancy, unspecified trimester: Secondary | ICD-10-CM

## 2017-05-25 DIAGNOSIS — O99210 Obesity complicating pregnancy, unspecified trimester: Secondary | ICD-10-CM

## 2017-05-26 ENCOUNTER — Other Ambulatory Visit (HOSPITAL_COMMUNITY): Payer: Self-pay | Admitting: *Deleted

## 2017-05-26 DIAGNOSIS — Z362 Encounter for other antenatal screening follow-up: Secondary | ICD-10-CM

## 2017-05-29 ENCOUNTER — Telehealth: Payer: Self-pay | Admitting: *Deleted

## 2017-05-29 DIAGNOSIS — B3731 Acute candidiasis of vulva and vagina: Secondary | ICD-10-CM

## 2017-05-29 DIAGNOSIS — B373 Candidiasis of vulva and vagina: Secondary | ICD-10-CM

## 2017-05-29 MED ORDER — TERCONAZOLE 0.8 % VA CREA: 1.0000 | TOPICAL_CREAM | Freq: Every day | VAGINAL | 0 refills | Status: DC

## 2017-05-29 MED ORDER — FLUCONAZOLE 150 MG PO TABS: 150.0000 mg | ORAL_TABLET | ORAL | 3 refills | Status: DC

## 2017-05-29 NOTE — Telephone Encounter (Signed)
Pt prefers Diflucan instead of terazol cream. Per v/o Dr Shawnie Ponspratt sent in Diflucan.

## 2017-05-29 NOTE — Telephone Encounter (Signed)
Pt states she only received 3 applicators of Terazol cream but thought she was supposed to take it for 7 days. She is still having vaginal itching.

## 2017-05-29 NOTE — Addendum Note (Signed)
Addended by: Arne ClevelandHUTCHINSON, Sanyah Molnar J on: 05/29/2017 01:44 PM   Modules accepted: Orders

## 2017-06-01 ENCOUNTER — Encounter: Payer: Self-pay | Admitting: Obstetrics and Gynecology

## 2017-06-01 ENCOUNTER — Ambulatory Visit (INDEPENDENT_AMBULATORY_CARE_PROVIDER_SITE_OTHER): Payer: BLUE CROSS/BLUE SHIELD | Admitting: Obstetrics and Gynecology

## 2017-06-01 VITALS — BP 129/84 | HR 92 | Wt 208.0 lb

## 2017-06-01 DIAGNOSIS — O99210 Obesity complicating pregnancy, unspecified trimester: Secondary | ICD-10-CM

## 2017-06-01 DIAGNOSIS — Z3480 Encounter for supervision of other normal pregnancy, unspecified trimester: Secondary | ICD-10-CM

## 2017-06-01 NOTE — Progress Notes (Signed)
   PRENATAL VISIT NOTE  Subjective:  Caroline Woods is a 27 y.o. G1P0000 at 6177w2d being seen today for ongoing prenatal care.  She is currently monitored for the following issues for this low-risk pregnancy and has Low grade squamous intraepithelial lesion (LGSIL) on cervical Pap smear; Late prenatal care affecting pregnancy; Supervision of other normal pregnancy, antepartum; Obesity affecting pregnancy, antepartum; Obesity, Class II, BMI 35-39.9; and Elevated BP without diagnosis of hypertension on their problem list.  Patient reports no complaints.  Contractions: Not present. Vag. Bleeding: None.  Movement: Present. Denies leaking of fluid.   The following portions of the patient's history were reviewed and updated as appropriate: allergies, current medications, past family history, past medical history, past social history, past surgical history and problem list. Problem list updated.  Objective:   Vitals:   06/01/17 1039  BP: 129/84  Pulse: 92  Weight: 208 lb (94.3 kg)    Fetal Status: Fetal Heart Rate (bpm): 125 Fundal Height: 33 cm Movement: Present     General:  Alert, oriented and cooperative. Patient is in no acute distress.  Skin: Skin is warm and dry. No rash noted.   Cardiovascular: Normal heart rate noted  Respiratory: Normal respiratory effort, no problems with respiration noted  Abdomen: Soft, gravid, appropriate for gestational age.  Pain/Pressure: Absent     Pelvic: Cervical exam deferred        Extremities: Normal range of motion.  Edema: Trace  Mental Status:  Normal mood and affect. Normal behavior. Normal judgment and thought content.   Assessment and Plan:  Pregnancy: G1P0000 at 7677w2d  1. Supervision of other normal pregnancy, antepartum Patient is doing well without complaints She plans on taking childbirth classes Follow up growth ultrasound scheduled later this month  2. Obesity affecting pregnancy, antepartum   Preterm labor symptoms and general  obstetric precautions including but not limited to vaginal bleeding, contractions, leaking of fluid and fetal movement were reviewed in detail with the patient. Please refer to After Visit Summary for other counseling recommendations.  No Follow-up on file.   Catalina AntiguaPeggy Carlen Fils, MD

## 2017-06-15 ENCOUNTER — Ambulatory Visit (INDEPENDENT_AMBULATORY_CARE_PROVIDER_SITE_OTHER): Payer: BLUE CROSS/BLUE SHIELD | Admitting: Obstetrics and Gynecology

## 2017-06-15 VITALS — BP 130/86 | HR 72 | Wt 212.8 lb

## 2017-06-15 DIAGNOSIS — Z3480 Encounter for supervision of other normal pregnancy, unspecified trimester: Secondary | ICD-10-CM

## 2017-06-15 NOTE — Progress Notes (Signed)
Prenatal Visit Note Date: 06/15/2017 Clinic: [facility]  Subjective:  Caroline BertholdChelsea Woods is a 27 y.o. G1P0000 at 6534w2d being seen today for ongoing prenatal care.  She is currently monitored for the following issues for this low-risk pregnancy and has Low grade squamous intraepithelial lesion (LGSIL) on cervical Pap smear; Late prenatal care affecting pregnancy; Supervision of other normal pregnancy, antepartum; Obesity affecting pregnancy, antepartum; Obesity, Class II, BMI 35-39.9; and Elevated BP without diagnosis of hypertension on their problem list.  Patient reports backache.   Contractions: Not present. Vag. Bleeding: None.  Movement: Present. Denies leaking of fluid.   The following portions of the patient's history were reviewed and updated as appropriate: allergies, current medications, past family history, past medical history, past social history, past surgical history and problem list. Problem list updated.  Objective:   Vitals:   06/15/17 0922  BP: 130/86  Pulse: 72  Weight: 212 lb 12.8 oz (96.5 kg)    Fetal Status: Fetal Heart Rate (bpm): 135 Fundal Height: 35 cm Movement: Present  Presentation: Vertex  General:  Alert, oriented and cooperative. Patient is in no acute distress.  Skin: Skin is warm and dry. No rash noted.   Cardiovascular: Normal heart rate noted  Respiratory: Normal respiratory effort, no problems with respiration noted  Abdomen: Soft, gravid, appropriate for gestational age. Pain/Pressure: Absent     Pelvic:  Cervical exam deferred        Extremities: Normal range of motion.  Edema: Trace  Mental Status: Normal mood and affect. Normal behavior. Normal judgment and thought content.   Urinalysis:      Assessment and Plan:  Pregnancy: G1P0000 at 7334w2d  1. Supervision of other normal pregnancy, antepartum Routine care. gbs nv  Preterm labor symptoms and general obstetric precautions including but not limited to vaginal bleeding,  contractions, leaking of fluid and fetal movement were reviewed in detail with the patient. Please refer to After Visit Summary for other counseling recommendations.  Return in about 1 week (around 06/22/2017) for rob.   [place] BingPickens, Rayel Santizo, MD

## 2017-06-17 ENCOUNTER — Inpatient Hospital Stay (HOSPITAL_COMMUNITY)
Admission: AD | Admit: 2017-06-17 | Discharge: 2017-06-21 | DRG: 786 | Disposition: A | Discharge status: Home / Self Care | Payer: BLUE CROSS/BLUE SHIELD | Source: Ambulatory Visit | Attending: Obstetrics and Gynecology | Admitting: Obstetrics and Gynecology

## 2017-06-17 ENCOUNTER — Encounter (HOSPITAL_COMMUNITY): Payer: Self-pay | Admitting: *Deleted

## 2017-06-17 ENCOUNTER — Other Ambulatory Visit: Payer: Self-pay

## 2017-06-17 ENCOUNTER — Inpatient Hospital Stay (HOSPITAL_COMMUNITY): Payer: BLUE CROSS/BLUE SHIELD

## 2017-06-17 DIAGNOSIS — O4590 Premature separation of placenta, unspecified, unspecified trimester: Secondary | ICD-10-CM | POA: Diagnosis present

## 2017-06-17 DIAGNOSIS — O99344 Other mental disorders complicating childbirth: Secondary | ICD-10-CM | POA: Diagnosis present

## 2017-06-17 DIAGNOSIS — O0930 Supervision of pregnancy with insufficient antenatal care, unspecified trimester: Secondary | ICD-10-CM

## 2017-06-17 DIAGNOSIS — Z3480 Encounter for supervision of other normal pregnancy, unspecified trimester: Secondary | ICD-10-CM

## 2017-06-17 DIAGNOSIS — O99210 Obesity complicating pregnancy, unspecified trimester: Secondary | ICD-10-CM | POA: Diagnosis present

## 2017-06-17 DIAGNOSIS — O4593 Premature separation of placenta, unspecified, third trimester: Principal | ICD-10-CM | POA: Diagnosis present

## 2017-06-17 DIAGNOSIS — Z3A35 35 weeks gestation of pregnancy: Secondary | ICD-10-CM | POA: Diagnosis not present

## 2017-06-17 DIAGNOSIS — O99214 Obesity complicating childbirth: Secondary | ICD-10-CM | POA: Diagnosis present

## 2017-06-17 DIAGNOSIS — O9902 Anemia complicating childbirth: Secondary | ICD-10-CM | POA: Diagnosis present

## 2017-06-17 DIAGNOSIS — E669 Obesity, unspecified: Secondary | ICD-10-CM | POA: Diagnosis present

## 2017-06-17 DIAGNOSIS — O26893 Other specified pregnancy related conditions, third trimester: Secondary | ICD-10-CM | POA: Diagnosis present

## 2017-06-17 DIAGNOSIS — O4693 Antepartum hemorrhage, unspecified, third trimester: Secondary | ICD-10-CM

## 2017-06-17 DIAGNOSIS — R030 Elevated blood-pressure reading, without diagnosis of hypertension: Secondary | ICD-10-CM | POA: Diagnosis present

## 2017-06-17 DIAGNOSIS — F909 Attention-deficit hyperactivity disorder, unspecified type: Secondary | ICD-10-CM | POA: Diagnosis present

## 2017-06-17 DIAGNOSIS — D649 Anemia, unspecified: Secondary | ICD-10-CM | POA: Diagnosis present

## 2017-06-17 DIAGNOSIS — Z98891 History of uterine scar from previous surgery: Secondary | ICD-10-CM

## 2017-06-17 LAB — COMPREHENSIVE METABOLIC PANEL
ALT: 12 U/L — ABNORMAL LOW (ref 14–54)
AST: 18 U/L (ref 15–41)
Albumin: 2.7 g/dL — ABNORMAL LOW (ref 3.5–5.0)
Alkaline Phosphatase: 78 U/L (ref 38–126)
Anion gap: 12 (ref 5–15)
BUN: 11 mg/dL (ref 6–20)
CO2: 18 mmol/L — ABNORMAL LOW (ref 22–32)
Calcium: 8.4 mg/dL — ABNORMAL LOW (ref 8.9–10.3)
Chloride: 103 mmol/L (ref 101–111)
Creatinine, Ser: 0.53 mg/dL (ref 0.44–1.00)
GFR calc Af Amer: >60 mL/min (ref >60)
GFR calc non Af Amer: >60 mL/min (ref >60)
Glucose, Bld: 114 mg/dL — ABNORMAL HIGH (ref 65–99)
Potassium: 4.1 mmol/L (ref 3.5–5.1)
Sodium: 133 mmol/L — ABNORMAL LOW (ref 135–145)
Total Bilirubin: 0.2 mg/dL — ABNORMAL LOW (ref 0.3–1.2)
Total Protein: 6.5 g/dL (ref 6.5–8.1)

## 2017-06-17 LAB — CBC
HCT: 31.6 % — ABNORMAL LOW (ref 36.0–46.0)
Hemoglobin: 10.5 g/dL — ABNORMAL LOW (ref 12.0–15.0)
MCH: 30.3 pg (ref 26.0–34.0)
MCHC: 33.2 g/dL (ref 30.0–36.0)
MCV: 91.3 fL (ref 78.0–100.0)
Platelets: 335 10*3/uL (ref 150–400)
RBC: 3.46 MIL/uL — ABNORMAL LOW (ref 3.87–5.11)
RDW: 14.1 % (ref 11.5–15.5)
WBC: 12.9 10*3/uL — ABNORMAL HIGH (ref 4.0–10.5)

## 2017-06-17 LAB — TYPE AND SCREEN
ABO/RH(D): A POS
Antibody Screen: NEGATIVE

## 2017-06-17 LAB — PROTEIN / CREATININE RATIO, URINE
Creatinine, Urine: 126 mg/dL
Protein Creatinine Ratio: 0.2 mg/mg{Cre} — ABNORMAL HIGH (ref 0.00–0.15)
Total Protein, Urine: 25 mg/dL

## 2017-06-17 LAB — GROUP B STREP BY PCR: Group B strep by PCR: NEGATIVE

## 2017-06-17 MED ORDER — SOD CITRATE-CITRIC ACID 500-334 MG/5ML PO SOLN
ORAL | Status: AC
  Administered 2017-06-18: 30 mL
  Filled 2017-06-17: qty 15

## 2017-06-17 MED ORDER — DOCUSATE SODIUM 100 MG PO CAPS: 100.0000 mg | ORAL_CAPSULE | Freq: Every day | ORAL | Status: DC

## 2017-06-17 MED ORDER — BETAMETHASONE SOD PHOS & ACET 6 (3-3) MG/ML IJ SUSP
12.0000 mg | Freq: Once | INTRAMUSCULAR | Status: AC
  Administered 2017-06-17: 12 mg via INTRAMUSCULAR
  Filled 2017-06-17: qty 2

## 2017-06-17 MED ORDER — NALBUPHINE HCL 10 MG/ML IJ SOLN
5.0000 mg | INTRAMUSCULAR | Status: DC | PRN
  Administered 2017-06-17: 5 mg via INTRAVENOUS
  Filled 2017-06-17: qty 1

## 2017-06-17 MED ORDER — ZOLPIDEM TARTRATE 5 MG PO TABS: 5.0000 mg | ORAL_TABLET | Freq: Every evening | ORAL | Status: DC | PRN

## 2017-06-17 MED ORDER — CALCIUM CARBONATE ANTACID 500 MG PO CHEW: 2.0000 | CHEWABLE_TABLET | ORAL | Status: DC | PRN

## 2017-06-17 MED ORDER — LACTATED RINGERS IV SOLN
INTRAVENOUS | Status: DC
  Administered 2017-06-17 (×2): via INTRAVENOUS
  Administered 2017-06-18: 900 mL via INTRAVENOUS
  Administered 2017-06-18: 04:00:00 via INTRAVENOUS

## 2017-06-17 MED ORDER — PRENATAL MULTIVITAMIN CH: 1.0000 | ORAL_TABLET | Freq: Every day | ORAL | Status: DC

## 2017-06-17 MED ORDER — ACETAMINOPHEN 325 MG PO TABS: 650.0000 mg | ORAL_TABLET | ORAL | Status: DC | PRN

## 2017-06-17 NOTE — H&P (Signed)
ANTEPARTUM ADMISSION HISTORY AND PHYSICAL NOTE   History of Present Illness: Caroline Woods is a 27 y.o. G1P0000 at 64w4dwho presents to maternity admissions reporting vaginal bleeding. She reports vaginal bleeding that started at 8pm tonight. She reports going to use the restroom and her pants being soaked in blood. She reports increased vaginal bleeding with movement and when she gets up it "pours out". She denies abdominal pain, cramping, contractions, she denies recent IC. She denies having a previa in previous US or any concerns during this pregnancy. She reports good fetal movement, denies LOF, vaginal itching/burning, urinary symptoms, h/a, dizziness, n/v, or fever/chills.   Patient Active Problem List   Diagnosis Date Noted  . Late prenatal care affecting pregnancy 04/19/2017  . Supervision of other normal pregnancy, antepartum 04/19/2017  . Obesity affecting pregnancy, antepartum 04/19/2017  . Obesity, Class II, BMI 35-39.9 04/19/2017  . Elevated BP without diagnosis of hypertension 04/19/2017  . Low grade squamous intraepithelial lesion (LGSIL) on cervical Pap smear 11/11/2014    Past Medical History:  Diagnosis Date  . ADHD (attention deficit hyperactivity disorder)   . Childhood asthma   . 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 Vaginal Pap smear, abnormal 2014   Unknown result    Past Surgical History:  Procedure Laterality Date  . NO PAST SURGERIES      OB History  Gravida Para Term Preterm AB Living  1 0 0 0 0 0  SAB TAB Ectopic Multiple Live Births  0 0 0 0 0    # Outcome Date GA Lbr Len/2nd Weight Sex Delivery Anes PTL Lv  1 Current             Social History   Socioeconomic History  . Marital status: Single    Spouse name: Not on file  . Number of children: Not on file  . Years of education: Not on file  . Highest education level: Not on file   Occupational History  . Not on file  Social Needs  . Financial resource strain: Not on file  . Food insecurity:    Worry: Not on file    Inability: Not on file  . Transportation needs:    Medical: Not on file    Non-medical: Not on file  Tobacco Use  . Smoking status: Never Smoker  . Smokeless tobacco: Never Used  Substance and Sexual Activity  . Alcohol use: No  . Drug use: No  . Sexual activity: Yes    Partners: Male    Birth control/protection: None  Lifestyle  . Physical activity:    Days per week: Not on file    Minutes per session: Not on file  . Stress: Not on file  Relationships  . Social connections:    Talks on phone: Not on file    Gets together: Not on file    Attends religious service: Not on file    Active member of club or organization: Not on file    Attends meetings of clubs or organizations: Not on file    Relationship status: Not on file  Other Topics Concern  . Not on file  Social History Narrative  . Not on file    Family History  Problem Relation Age of Onset  . Hypertension Mother   . Kidney cancer Maternal Grandfather     No Known Allergies  Medications Prior  to Admission  Medication Sig Dispense Refill Last Dose  . lisdexamfetamine (VYVANSE) 50 MG capsule Take 50 mg by mouth daily.   Past Week at Unknown time  . Prenatal Vit-Fe Fumarate-FA (MULTIVITAMIN-PRENATAL) 27-0.8 MG TABS tablet Take 1 tablet by mouth daily at 12 noon.   06/17/2017 at Unknown time  . fluconazole (DIFLUCAN) 150 MG tablet Take 1 tablet (150 mg total) by mouth every 3 (three) days. For three doses (Patient not taking: Reported on 06/15/2017) 3 tablet 3 Not Taking    Review of Systems - All systems reviewed and are negative for acute change except as noted in the HPI.  Vitals:  BP (!) 148/86 (BP Location: Right Arm)   Pulse (!) 130   Temp 98.6 F (37 C) (Oral)   Resp 18   LMP 12/25/2016 (LMP Unknown)   SpO2 98%  Physical Examination: CONSTITUTIONAL:  Well-developed, obese female in no acute distress.  HENT:  Normocephalic, atraumatic, External right and left ear normal. Oropharynx is clear and moist EYES: Conjunctivae and EOM are normal. Pupils are equal, round, and reactive to light. No scleral icterus.  NECK: Normal range of motion, supple, no masses SKIN: Skin is warm and dry.  NEUROLGIC: Alert and oriented to person, place, and time.  PSYCHIATRIC: Normal mood and affect. Normal behavior. Normal judgment and thought content. CARDIOVASCULAR: Normal heart rate noted, regular rhythm RESPIRATORY: Effort and breath sounds normal, no problems with respiration noted ABDOMEN: Soft, nontender, nondistended, gravid. MUSCULOSKELETAL: Normal range of motion. No edema and no tenderness. 2+ distal pulses.  Cervix: Dilation: Fingertip Effacement (%): Thick Exam by:: V.Rogers CNM  Membranes:intact Fetal Monitoring:Baseline: 155 bpm, Variability: Good {> 6 bpm), Accelerations: Reactive and Decelerations: Variable Tocometer: Flat  Labs:  Results for orders placed or performed during the hospital encounter of 06/17/17 (from the past 24 hour(s))  CBC   Collection Time: 06/17/17  9:11 PM  Result Value Ref Range   WBC 12.9 (H) 4.0 - 10.5 K/uL   RBC 3.46 (L) 3.87 - 5.11 MIL/uL   Hemoglobin 10.5 (L) 12.0 - 15.0 g/dL   HCT 09.8 (L) 11.9 - 14.7 %   MCV 91.3 78.0 - 100.0 fL   MCH 30.3 26.0 - 34.0 pg   MCHC 33.2 30.0 - 36.0 g/dL   RDW 82.9 56.2 - 13.0 %   Platelets 335 150 - 400 K/uL  Comprehensive metabolic panel   Collection Time: 06/17/17  9:11 PM  Result Value Ref Range   Sodium 133 (L) 135 - 145 mmol/L   Potassium 4.1 3.5 - 5.1 mmol/L   Chloride 103 101 - 111 mmol/L   CO2 18 (L) 22 - 32 mmol/L   Glucose, Bld 114 (H) 65 - 99 mg/dL   BUN 11 6 - 20 mg/dL   Creatinine, Ser 8.65 0.44 - 1.00 mg/dL   Calcium 8.4 (L) 8.9 - 10.3 mg/dL   Total Protein 6.5 6.5 - 8.1 g/dL   Albumin 2.7 (L) 3.5 - 5.0 g/dL   AST 18 15 - 41 U/L   ALT 12 (L) 14 - 54  U/L   Alkaline Phosphatase 78 38 - 126 U/L   Total Bilirubin 0.2 (L) 0.3 - 1.2 mg/dL   GFR calc non Af Amer >60 >60 mL/min   GFR calc Af Amer >60 >60 mL/min   Anion gap 12 5 - 15  Type and screen [facility]   Collection Time: 06/17/17  9:11 PM  Result Value Ref Range   ABO/RH(D) A POS  Antibody Screen NEG    Sample Expiration      06/20/2017 Performed at [facility], [address] [place], [place] [id number]   Protein / creatinine ratio, urine   Collection Time: 06/17/17  9:41 PM  Result Value Ref Range   Creatinine, Urine 126.00 mg/dL   Total Protein, Urine 25 mg/dL   Protein Creatinine Ratio 0.20 (H) 0.00 - 0.15 mg/mg[Cre]  Group B strep by PCR   Collection Time: 06/17/17  9:50 PM  Result Value Ref Range   Group B strep by PCR NEGATIVE NEGATIVE    Imaging Studies: [place] Mfm Ob Limited  Result Date: 06/17/2017 ----------------------------------------------------------------------  OBSTETRICS REPORT                      (Signed Final 06/17/2017 10:56 pm) ---------------------------------------------------------------------- Patient Info  ID #:       [id number]                          D.O.B.:  1990/11/22 (26 yrs)  Name:       Caroline Woods                    Visit Date: 06/17/2017 09:29 pm ---------------------------------------------------------------------- Performed By  Performed By:     Velna HatchetSheila Driskill        [address]      [address]                                                              [address]  Attending:        Charlsie MerlesMark Newman MD         [facility]  Referred By:      Federico FlakeKIMBERLY NILES                    NEWTON MD ---------------------------------------------------------------------- Orders   #  Description                                 Code   1  US MFM OB LIMITED                           [id number]  ----------------------------------------------------------------------   #  Ordered By               Order  #        Accession #    Episode #   1  Steward DroneVERONICA ROGERS          [id number]      [id number]     [id number]  ---------------------------------------------------------------------- Indications   [redacted] weeks gestation of pregnancy                Z3A.35   Vaginal bleeding in pregnancy, third trimester O46.93  ---------------------------------------------------------------------- OB History  Gravidity:    1 ---------------------------------------------------------------------- Fetal Evaluation  Num Of Fetuses:     1  Fetal Heart  150  Rate(bpm):  Cardiac Activity:   Observed  Presentation:       Vertex  Amniotic Fluid  AFI FV:      Subjectively within normal limits  AFI Sum(cm)     %Tile       Largest Pocket(cm)  14.51           53          5.97  RUQ(cm)       RLQ(cm)       LUQ(cm)        LLQ(cm)  5.25          3.29          5.97           0 ---------------------------------------------------------------------- Gestational Age  Best:          35w 4d     Det. By:  U/S  (04/27/17)          EDD:   07/18/17 ---------------------------------------------------------------------- Impression  Mason Jim intrauterine pregnancy at 35+4 weeks with vaginal  bleeding here for requested limited scan  Normal fetal cardiac activity and fetal movement  Amniotic fluid volume is normal  There is a posterior placenta with no previa. there are  extensive sonographically heterogenous areas retroplacental  and subchorionic that may represent areas of bleeding ---------------------------------------------------------------------- Recommendations  The scan is consistent with abruption; however, the diagnosis  of abruption is clinical rather than sonpgraphic. Clinical  correlation is recommended ----------------------------------------------------------------------                 Charlsie Merles, MD Electronically Signed Final Report   06/17/2017 10:56 pm ----------------------------------------------------------------------   Assessment and  Plan: 1. Vaginal bleeding, third trimester 2. Placental Abruption 3. SIUP 4. Obesity   Admit to Labor and Delivery for close monitoring Fetal heart tracing reassuring at this time Routine antenatal care BMZ x2 ordered mIVF GBS negative   Caryl Ada, DO OB Fellow Faculty Practice, [facility]

## 2017-06-17 NOTE — MAU Provider Note (Signed)
Chief Complaint:  Vaginal Bleeding   First Provider Initiated Contact with Patient 06/17/17 2038      HPI: Caroline Woods is a 27 y.o. G1P0000 at 109w4dwho presents to maternity admissions reporting vaginal bleeding. She reports vaginal bleeding that started at 8pm tonight. She reports going to use the restroom and her pants being soaked in blood. She reports increased vaginal bleeding with movement and when she gets up it "pours out". She denies abdominal pain, cramping, contractions, she denies recent IC. She denies having a previa in previous US or any concerns during this pregnancy. She reports good fetal movement, denies LOF, vaginal itching/burning, urinary symptoms, h/a, dizziness, n/v, or fever/chills.   Past Medical History: Past Medical History:  Diagnosis Date  . ADHD (attention deficit hyperactivity disorder)   . Childhood asthma   . 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 Vaginal Pap smear, abnormal 2014   Unknown result    Past obstetric history: OB History  Gravida Para Term Preterm AB Living  1 0 0 0 0 0  SAB TAB Ectopic Multiple Live Births  0 0 0 0 0    # Outcome Date GA Lbr Len/2nd Weight Sex Delivery Anes PTL Lv  1 Current             Past Surgical History: Past Surgical History:  Procedure Laterality Date  . NO PAST SURGERIES      Family History: Family History  Problem Relation Age of Onset  . Hypertension Mother   . Kidney cancer Maternal Grandfather     Social History: Social History   Tobacco Use  . Smoking status: Never Smoker  . Smokeless tobacco: Never Used  Substance Use Topics  . Alcohol use: No  . Drug use: No    Allergies: No Known Allergies  Meds:  Medications Prior to Admission  Medication Sig Dispense Refill Last Dose  . lisdexamfetamine (VYVANSE) 50 MG capsule Take 50 mg by mouth daily.   Past Week at Unknown time  .  Prenatal Vit-Fe Fumarate-FA (MULTIVITAMIN-PRENATAL) 27-0.8 MG TABS tablet Take 1 tablet by mouth daily at 12 noon.   06/17/2017 at Unknown time  . fluconazole (DIFLUCAN) 150 MG tablet Take 1 tablet (150 mg total) by mouth every 3 (three) days. For three doses (Patient not taking: Reported on 06/15/2017) 3 tablet 3 Not Taking    ROS:  Review of Systems  Constitutional: Negative.   Respiratory: Negative.   Cardiovascular: Negative.   Gastrointestinal: Negative for abdominal pain, nausea and vomiting.  Genitourinary: Positive for vaginal bleeding.  Musculoskeletal: Negative.   Neurological: Negative.    I have reviewed patient's Past Medical Hx, Surgical Hx, Family Hx, Social Hx, medications and allergies.   Physical Exam  No data found. Constitutional: Well-developed, well-nourished female in no acute distress.  Cardiovascular: normal rate Respiratory: normal effort GI: Abd firm, non-tender, gravid appropriate for gestational age.  MS: Extremities nontender, no edema, normal ROM Neurologic: Alert and oriented x 4.  GU: Neg CVAT.  PELVIC EXAM: Cervix pink, visually closed, without lesion, copious amounts of bright red blood present on insertion of speculum, no clots present, active bleeding coming from cervix after multiple faux swabs used to remove blood for visual assessment, vaginal walls and external genitalia normal CERVICAL exam:  Dilation: Fingertip Effacement (%): Thick Exam by:: V.Genoa Freyre CNM   FHT:  Baseline 155 , moderate variability, accelerations  present, one deceleration noted on monitor  Contractions: none   Labs: Results for orders placed or performed during the hospital encounter of 06/17/17 (from the past 24 hour(s))  CBC     Status: Abnormal   Collection Time: 06/17/17  9:11 PM  Result Value Ref Range   WBC 12.9 (H) 4.0 - 10.5 K/uL   RBC 3.46 (L) 3.87 - 5.11 MIL/uL   Hemoglobin 10.5 (L) 12.0 - 15.0 g/dL   HCT 82.931.6 (L) 56.236.0 - 13.046.0 %   MCV 91.3 78.0 - 100.0 fL    MCH 30.3 26.0 - 34.0 pg   MCHC 33.2 30.0 - 36.0 g/dL   RDW 86.514.1 78.411.5 - 69.615.5 %   Platelets 335 150 - 400 K/uL  Comprehensive metabolic panel     Status: Abnormal   Collection Time: 06/17/17  9:11 PM  Result Value Ref Range   Sodium 133 (L) 135 - 145 mmol/L   Potassium 4.1 3.5 - 5.1 mmol/L   Chloride 103 101 - 111 mmol/L   CO2 18 (L) 22 - 32 mmol/L   Glucose, Bld 114 (H) 65 - 99 mg/dL   BUN 11 6 - 20 mg/dL   Creatinine, Ser 2.950.53 0.44 - 1.00 mg/dL   Calcium 8.4 (L) 8.9 - 10.3 mg/dL   Total Protein 6.5 6.5 - 8.1 g/dL   Albumin 2.7 (L) 3.5 - 5.0 g/dL   AST 18 15 - 41 U/L   ALT 12 (L) 14 - 54 U/L   Alkaline Phosphatase 78 38 - 126 U/L   Total Bilirubin 0.2 (L) 0.3 - 1.2 mg/dL   GFR calc non Af Amer >60 >60 mL/min   GFR calc Af Amer >60 >60 mL/min   Anion gap 12 5 - 15  Protein / creatinine ratio, urine     Status: Abnormal   Collection Time: 06/17/17  9:41 PM  Result Value Ref Range   Creatinine, Urine 126.00 mg/dL   Total Protein, Urine 25 mg/dL   Protein Creatinine Ratio 0.20 (H) 0.00 - 0.15 mg/mg[Cre]   A/Positive/-- (01/23 1140)  Imaging:  FHR: 150 Presentation: Vertex Placenta: sonographer questionable abruption  Final US report pending   MAU Course/MDM: Orders Placed This Encounter  Procedures  . Group B strep by PCR  . US MFM OB LIMITED  . CBC  . RPR  . Comprehensive metabolic panel  . Protein / creatinine ratio, urine  . In and Out Cath  . [facility]  . Insert peripheral IV    Meds ordered this encounter  Medications  . betamethasone acetate-betamethasone sodium phosphate (CELESTONE) injection 12 mg  . lactated ringers infusion    NST reviewed-reactive  Consult Dr Jolayne Pantheronstant with presentation, exam findings and test results. [facility] Admit to [facility]- care transferred and orders placed    Assessment: 1. Vaginal bleeding in pregnancy, third trimester     Plan: Admit to L&D Care transferred to L&D team,  orders placed    Steward DroneVeronica Grayling Schranz Certified Nurse-Midwife 06/17/2017 10:31 PM

## 2017-06-17 NOTE — MAU Note (Signed)
Pt presents to MAU c/o heavy vaginal bleeding. Pt reports +FM. Denies Pain. Denies LOF.Bleeding started at 2100 and had bled thru her pants.

## 2017-06-18 ENCOUNTER — Inpatient Hospital Stay (HOSPITAL_COMMUNITY): Payer: BLUE CROSS/BLUE SHIELD | Admitting: Anesthesiology

## 2017-06-18 ENCOUNTER — Encounter (HOSPITAL_COMMUNITY)
Admission: AD | Disposition: A | Discharge status: Home / Self Care | Payer: Self-pay | Source: Ambulatory Visit | Attending: Obstetrics and Gynecology

## 2017-06-18 ENCOUNTER — Encounter (HOSPITAL_COMMUNITY): Payer: Self-pay

## 2017-06-18 DIAGNOSIS — O4590 Premature separation of placenta, unspecified, unspecified trimester: Secondary | ICD-10-CM | POA: Diagnosis present

## 2017-06-18 DIAGNOSIS — O4593 Premature separation of placenta, unspecified, third trimester: Secondary | ICD-10-CM

## 2017-06-18 DIAGNOSIS — Z3A35 35 weeks gestation of pregnancy: Secondary | ICD-10-CM

## 2017-06-18 LAB — CREATININE, SERUM
Creatinine, Ser: 0.56 mg/dL (ref 0.44–1.00)
GFR calc Af Amer: >60 mL/min (ref >60)
GFR calc non Af Amer: >60 mL/min (ref >60)

## 2017-06-18 LAB — CBC
HCT: 25.4 % — ABNORMAL LOW (ref 36.0–46.0)
Hemoglobin: 8.5 g/dL — ABNORMAL LOW (ref 12.0–15.0)
MCH: 30.6 pg (ref 26.0–34.0)
MCHC: 33.5 g/dL (ref 30.0–36.0)
MCV: 91.4 fL (ref 78.0–100.0)
PLATELETS: 356 10*3/uL (ref 150–400)
RBC: 2.78 MIL/uL — ABNORMAL LOW (ref 3.87–5.11)
RDW: 14.1 % (ref 11.5–15.5)
WBC: 19.3 10*3/uL — ABNORMAL HIGH (ref 4.0–10.5)

## 2017-06-18 LAB — ABO/RH: ABO/RH(D): A POS

## 2017-06-18 LAB — RPR: RPR Ser Ql: NONREACTIVE

## 2017-06-18 SURGERY — Surgical Case
Anesthesia: Spinal

## 2017-06-18 MED ORDER — MORPHINE SULFATE (PF) 0.5 MG/ML IJ SOLN
INTRAMUSCULAR | Status: AC
  Filled 2017-06-18: qty 10

## 2017-06-18 MED ORDER — CEFAZOLIN SODIUM-DEXTROSE 2-4 GM/100ML-% IV SOLN
INTRAVENOUS | Status: AC
  Filled 2017-06-18: qty 100

## 2017-06-18 MED ORDER — ACETAMINOPHEN 10 MG/ML IV SOLN: 1000.0000 mg | Freq: Once | INTRAVENOUS | Status: DC | PRN

## 2017-06-18 MED ORDER — ONDANSETRON HCL 4 MG/2ML IJ SOLN
INTRAMUSCULAR | Status: AC
Start: 2017-06-18 — End: 2017-06-18
  Filled 2017-06-18: qty 2

## 2017-06-18 MED ORDER — NALBUPHINE HCL 10 MG/ML IJ SOLN: 5.0000 mg | Freq: Once | INTRAMUSCULAR | Status: DC | PRN

## 2017-06-18 MED ORDER — NALOXONE HCL 4 MG/10ML IJ SOLN
1.0000 ug/kg/h | INTRAVENOUS | Status: DC | PRN
  Filled 2017-06-18: qty 5

## 2017-06-18 MED ORDER — OXYTOCIN 10 UNIT/ML IJ SOLN
INTRAMUSCULAR | Status: AC
  Filled 2017-06-18: qty 1

## 2017-06-18 MED ORDER — OXYCODONE HCL 5 MG PO TABS
5.0000 mg | ORAL_TABLET | ORAL | Status: DC | PRN
  Administered 2017-06-18: 5 mg via ORAL
  Filled 2017-06-18 (×2): qty 1

## 2017-06-18 MED ORDER — KETOROLAC TROMETHAMINE 30 MG/ML IJ SOLN: 30.0000 mg | Freq: Four times a day (QID) | INTRAMUSCULAR | Status: DC | PRN

## 2017-06-18 MED ORDER — MORPHINE SULFATE (PF) 0.5 MG/ML IJ SOLN
INTRAMUSCULAR | Status: DC | PRN
  Administered 2017-06-18: .2 mg via INTRATHECAL

## 2017-06-18 MED ORDER — LACTATED RINGERS IV SOLN
INTRAVENOUS | Status: DC | PRN
  Administered 2017-06-18: 40 [IU] via INTRAVENOUS

## 2017-06-18 MED ORDER — NALBUPHINE HCL 10 MG/ML IJ SOLN: 5.0000 mg | INTRAMUSCULAR | Status: DC | PRN

## 2017-06-18 MED ORDER — DIBUCAINE 1 % RE OINT: 1.0000 "application " | TOPICAL_OINTMENT | RECTAL | Status: DC | PRN

## 2017-06-18 MED ORDER — ZOLPIDEM TARTRATE 5 MG PO TABS: 5.0000 mg | ORAL_TABLET | Freq: Every evening | ORAL | Status: DC | PRN

## 2017-06-18 MED ORDER — DIPHENHYDRAMINE HCL 50 MG/ML IJ SOLN
12.5000 mg | INTRAMUSCULAR | Status: DC | PRN
  Administered 2017-06-18: 12.5 mg via INTRAVENOUS
  Filled 2017-06-18: qty 1

## 2017-06-18 MED ORDER — LACTATED RINGERS IV SOLN: INTRAVENOUS | Status: DC

## 2017-06-18 MED ORDER — PHENYLEPHRINE 40 MCG/ML (10ML) SYRINGE FOR IV PUSH (FOR BLOOD PRESSURE SUPPORT)
PREFILLED_SYRINGE | INTRAVENOUS | Status: AC
  Filled 2017-06-18: qty 10

## 2017-06-18 MED ORDER — PHENYLEPHRINE HCL 10 MG/ML IJ SOLN
INTRAMUSCULAR | Status: DC | PRN
  Administered 2017-06-18 (×6): 80 ug via INTRAVENOUS

## 2017-06-18 MED ORDER — OXYTOCIN 40 UNITS IN LACTATED RINGERS INFUSION - SIMPLE MED: 2.5000 [IU]/h | INTRAVENOUS | Status: AC

## 2017-06-18 MED ORDER — PRENATAL MULTIVITAMIN CH
1.0000 | ORAL_TABLET | Freq: Every day | ORAL | Status: DC
  Administered 2017-06-18 – 2017-06-21 (×4): 1 via ORAL
  Filled 2017-06-18 (×4): qty 1

## 2017-06-18 MED ORDER — OXYCODONE HCL 5 MG PO TABS: 10.0000 mg | ORAL_TABLET | ORAL | Status: DC | PRN

## 2017-06-18 MED ORDER — SIMETHICONE 80 MG PO CHEW
80.0000 mg | CHEWABLE_TABLET | ORAL | Status: DC
  Administered 2017-06-20 (×2): 80 mg via ORAL
  Filled 2017-06-18 (×4): qty 1

## 2017-06-18 MED ORDER — SIMETHICONE 80 MG PO CHEW
80.0000 mg | CHEWABLE_TABLET | Freq: Three times a day (TID) | ORAL | Status: DC
  Administered 2017-06-18 – 2017-06-21 (×9): 80 mg via ORAL
  Filled 2017-06-18 (×10): qty 1

## 2017-06-18 MED ORDER — CEFAZOLIN SODIUM-DEXTROSE 2-4 GM/100ML-% IV SOLN
2.0000 g | INTRAVENOUS | Status: DC
  Filled 2017-06-18: qty 100

## 2017-06-18 MED ORDER — ENOXAPARIN SODIUM 40 MG/0.4ML ~~LOC~~ SOLN
40.0000 mg | SUBCUTANEOUS | Status: DC
  Administered 2017-06-18 – 2017-06-20 (×3): 40 mg via SUBCUTANEOUS
  Filled 2017-06-18 (×4): qty 0.4

## 2017-06-18 MED ORDER — TETANUS-DIPHTH-ACELL PERTUSSIS 5-2.5-18.5 LF-MCG/0.5 IM SUSP: 0.5000 mL | Freq: Once | INTRAMUSCULAR | Status: DC

## 2017-06-18 MED ORDER — HYDROMORPHONE HCL 1 MG/ML IJ SOLN
0.2500 mg | INTRAMUSCULAR | Status: DC | PRN
Start: 2017-06-18 — End: 2017-06-18

## 2017-06-18 MED ORDER — ENOXAPARIN SODIUM 40 MG/0.4ML ~~LOC~~ SOLN
40.0000 mg | SUBCUTANEOUS | Status: DC
  Filled 2017-06-18: qty 0.4

## 2017-06-18 MED ORDER — SENNOSIDES-DOCUSATE SODIUM 8.6-50 MG PO TABS
2.0000 | ORAL_TABLET | ORAL | Status: DC
  Administered 2017-06-18 – 2017-06-20 (×3): 2 via ORAL
  Filled 2017-06-18 (×3): qty 2

## 2017-06-18 MED ORDER — COCONUT OIL OIL: 1.0000 "application " | TOPICAL_OIL | Status: DC | PRN

## 2017-06-18 MED ORDER — CEFAZOLIN SODIUM-DEXTROSE 2-3 GM-%(50ML) IV SOLR
INTRAVENOUS | Status: DC | PRN
  Administered 2017-06-18: 2 g via INTRAVENOUS

## 2017-06-18 MED ORDER — ONDANSETRON HCL 4 MG/2ML IJ SOLN
INTRAMUSCULAR | Status: DC | PRN
  Administered 2017-06-18: 4 mg via INTRAVENOUS

## 2017-06-18 MED ORDER — MEPERIDINE HCL 25 MG/ML IJ SOLN: 6.2500 mg | INTRAMUSCULAR | Status: DC | PRN

## 2017-06-18 MED ORDER — DIPHENHYDRAMINE HCL 25 MG PO CAPS: 25.0000 mg | ORAL_CAPSULE | ORAL | Status: DC | PRN

## 2017-06-18 MED ORDER — ENOXAPARIN SODIUM 40 MG/0.4ML ~~LOC~~ SOLN: 40.0000 mg | SUBCUTANEOUS | Status: DC

## 2017-06-18 MED ORDER — ACETAMINOPHEN 325 MG PO TABS: 650.0000 mg | ORAL_TABLET | ORAL | Status: DC | PRN

## 2017-06-18 MED ORDER — SIMETHICONE 80 MG PO CHEW
80.0000 mg | CHEWABLE_TABLET | ORAL | Status: DC | PRN
  Administered 2017-06-19: 80 mg via ORAL

## 2017-06-18 MED ORDER — PROMETHAZINE HCL 25 MG/ML IJ SOLN
6.2500 mg | INTRAMUSCULAR | Status: DC | PRN
Start: 2017-06-18 — End: 2017-06-18

## 2017-06-18 MED ORDER — HYDROCODONE-ACETAMINOPHEN 7.5-325 MG PO TABS: 1.0000 | ORAL_TABLET | Freq: Once | ORAL | Status: DC | PRN

## 2017-06-18 MED ORDER — DIPHENHYDRAMINE HCL 25 MG PO CAPS
25.0000 mg | ORAL_CAPSULE | Freq: Four times a day (QID) | ORAL | Status: DC | PRN
  Administered 2017-06-18 – 2017-06-19 (×2): 25 mg via ORAL
  Filled 2017-06-18 (×2): qty 1

## 2017-06-18 MED ORDER — SODIUM CHLORIDE 0.9 % IR SOLN
Status: DC | PRN
  Administered 2017-06-18: 1000 mL

## 2017-06-18 MED ORDER — WITCH HAZEL-GLYCERIN EX PADS: 1.0000 "application " | MEDICATED_PAD | CUTANEOUS | Status: DC | PRN

## 2017-06-18 MED ORDER — MENTHOL 3 MG MT LOZG: 1.0000 | LOZENGE | OROMUCOSAL | Status: DC | PRN

## 2017-06-18 MED ORDER — IBUPROFEN 600 MG PO TABS
600.0000 mg | ORAL_TABLET | Freq: Four times a day (QID) | ORAL | Status: DC
  Administered 2017-06-18 – 2017-06-21 (×13): 600 mg via ORAL
  Filled 2017-06-18 (×14): qty 1

## 2017-06-18 MED ORDER — ONDANSETRON HCL 4 MG/2ML IJ SOLN: 4.0000 mg | Freq: Three times a day (TID) | INTRAMUSCULAR | Status: DC | PRN

## 2017-06-18 MED ORDER — BUPIVACAINE IN DEXTROSE 0.75-8.25 % IT SOLN
INTRATHECAL | Status: DC | PRN
  Administered 2017-06-18: 1.6 mL via INTRATHECAL

## 2017-06-18 MED ORDER — SCOPOLAMINE 1 MG/3DAYS TD PT72
1.0000 | MEDICATED_PATCH | Freq: Once | TRANSDERMAL | Status: DC
  Filled 2017-06-18: qty 1

## 2017-06-18 MED ORDER — NALOXONE HCL 0.4 MG/ML IJ SOLN: 0.4000 mg | INTRAMUSCULAR | Status: DC | PRN

## 2017-06-18 MED ORDER — SODIUM CHLORIDE 0.9% FLUSH: 3.0000 mL | INTRAVENOUS | Status: DC | PRN

## 2017-06-18 SURGICAL SUPPLY — 35 items
APL SKNCLS STERI-STRIP NONHPOA (GAUZE/BANDAGES/DRESSINGS) ×1
BENZOIN TINCTURE PRP APPL 2/3 (GAUZE/BANDAGES/DRESSINGS) ×2 IMPLANT
BRR ADH 6X5 SEPRAFILM 1 SHT (MISCELLANEOUS)
CHLORAPREP W/TINT 26ML (MISCELLANEOUS) ×3 IMPLANT
CLAMP CORD UMBIL (MISCELLANEOUS) ×2 IMPLANT
CLOSURE WOUND 1/2 X4 (GAUZE/BANDAGES/DRESSINGS) ×1
DRSG OPSITE POSTOP 4X10 (GAUZE/BANDAGES/DRESSINGS) ×3 IMPLANT
ELECT REM PT RETURN 9FT ADLT (ELECTROSURGICAL) ×3
ELECTRODE REM PT RTRN 9FT ADLT (ELECTROSURGICAL) ×1 IMPLANT
EXTRACTOR VACUUM M CUP 4 TUBE (SUCTIONS) IMPLANT
EXTRACTOR VACUUM M CUP 4' TUBE (SUCTIONS)
GLOVE BIOGEL PI IND STRL 6.5 (GLOVE) ×1 IMPLANT
GLOVE BIOGEL PI IND STRL 7.0 (GLOVE) ×1 IMPLANT
GLOVE BIOGEL PI INDICATOR 6.5 (GLOVE) ×4
GLOVE BIOGEL PI INDICATOR 7.0 (GLOVE) ×12
GLOVE SURG SS PI 6.0 STRL IVOR (GLOVE) ×5 IMPLANT
GOWN STRL REUS W/TWL LRG LVL3 (GOWN DISPOSABLE) ×8 IMPLANT
KIT ABG SYR 3ML LUER SLIP (SYRINGE) ×2 IMPLANT
NDL HYPO 25X5/8 SAFETYGLIDE (NEEDLE) IMPLANT
NEEDLE HYPO 25X5/8 SAFETYGLIDE (NEEDLE) ×3 IMPLANT
NS IRRIG 1000ML POUR BTL (IV SOLUTION) ×3 IMPLANT
PACK C SECTION WH (CUSTOM PROCEDURE TRAY) ×3 IMPLANT
PAD ABD 8X7 1/2 STERILE (GAUZE/BANDAGES/DRESSINGS) ×2 IMPLANT
PAD OB MATERNITY 4.3X12.25 (PERSONAL CARE ITEMS) ×3 IMPLANT
PENCIL SMOKE EVAC W/HOLSTER (ELECTROSURGICAL) ×3 IMPLANT
RTRCTR C-SECT PINK 25CM LRG (MISCELLANEOUS) ×2 IMPLANT
SEPRAFILM MEMBRANE 5X6 (MISCELLANEOUS) IMPLANT
SPONGE GAUZE 4X4 12PLY (GAUZE/BANDAGES/DRESSINGS) ×4 IMPLANT
STRIP CLOSURE SKIN 1/2X4 (GAUZE/BANDAGES/DRESSINGS) ×1 IMPLANT
SUT PLAIN 0 NONE (SUTURE) IMPLANT
SUT VIC AB 0 CT1 36 (SUTURE) ×14 IMPLANT
SUT VIC AB 4-0 KS 27 (SUTURE) ×5 IMPLANT
TAPE CLOTH SURG 4X10 WHT LF (GAUZE/BANDAGES/DRESSINGS) ×2 IMPLANT
TOWEL OR 17X24 6PK STRL BLUE (TOWEL DISPOSABLE) ×5 IMPLANT
TRAY FOLEY BAG SILVER LF 14FR (SET/KITS/TRAYS/PACK) ×3 IMPLANT

## 2017-06-18 NOTE — Transfer of Care (Signed)
Immediate Anesthesia Transfer of Care Note  Patient: Caroline BertholdChelsea Stolp  Procedure(s) Performed: CESAREAN SECTION (N/A )  Patient Location: PACU  Anesthesia Type:Spinal  Level of Consciousness: awake, alert , oriented and patient cooperative  Airway & Oxygen Therapy: Patient Spontanous Breathing  Post-op Assessment: Report given to RN and Post -op Vital signs reviewed and stable  Post vital signs: Reviewed and stable  Last Vitals:  Vitals Value Taken Time  BP    Temp    Pulse 94 06/18/2017  5:26 AM  Resp 15 06/18/2017  5:26 AM  SpO2 99 % 06/18/2017  5:26 AM    Last Pain:  Vitals:   06/18/17 0220  TempSrc:   PainSc: 0-No pain         Complications: No apparent anesthesia complications

## 2017-06-18 NOTE — Anesthesia Procedure Notes (Signed)
Spinal  Patient location during procedure: OB Start time: 06/18/2017 4:10 AM End time: 06/18/2017 4:18 AM Staffing Anesthesiologist: Trevor IhaHouser, Stephen A, MD Performed: anesthesiologist  Preanesthetic Checklist Completed: patient identified, surgical consent, pre-op evaluation, timeout performed, IV checked, risks and benefits discussed and monitors and equipment checked Spinal Block Patient position: right lateral decubitus Prep: site prepped and draped and DuraPrep Patient monitoring: heart rate, cardiac monitor, continuous pulse ox and blood pressure Approach: midline Location: L3-4 Injection technique: single-shot Needle Needle type: Pencan  Needle gauge: 24 G Needle length: 10 cm Assessment Sensory level: T4

## 2017-06-18 NOTE — Anesthesia Preprocedure Evaluation (Signed)
Anesthesia Evaluation  Patient identified by MRN, date of birth, ID band Patient awake    Reviewed: Allergy & Precautions, NPO status , Patient's Chart, lab work & pertinent test results  Airway Mallampati: III  TM Distance: >3 FB Neck ROM: Full    Dental no notable dental hx.    Pulmonary asthma ,    Pulmonary exam normal breath sounds clear to auscultation       Cardiovascular negative cardio ROS Normal cardiovascular exam Rhythm:Regular Rate:Normal     Neuro/Psych    GI/Hepatic   Endo/Other  Morbid obesity  Renal/GU      Musculoskeletal   Abdominal   Peds  Hematology   Anesthesia Other Findings   Reproductive/Obstetrics (+) Pregnancy                             Lab Results  Component Value Date   WBC 12.9 (H) 06/17/2017   HGB 10.5 (L) 06/17/2017   HCT 31.6 (L) 06/17/2017   MCV 91.3 06/17/2017   PLT 335 06/17/2017    Anesthesia Physical Anesthesia Plan  ASA: III and emergent  Anesthesia Plan: Spinal   Post-op Pain Management:    Induction:   PONV Risk Score and Plan:   Airway Management Planned: Mask, Natural Airway and Nasal Cannula  Additional Equipment:   Intra-op Plan:   Post-operative Plan:   Informed Consent: I have reviewed the patients History and Physical, chart, labs and discussed the procedure including the risks, benefits and alternatives for the proposed anesthesia with the patient or authorized representative who has indicated his/her understanding and acceptance.     Plan Discussed with: CRNA  Anesthesia Plan Comments:         Anesthesia Quick Evaluation

## 2017-06-18 NOTE — Progress Notes (Signed)
DBL. Breast pump initiated.

## 2017-06-18 NOTE — Lactation Note (Signed)
This note was copied from a baby's chart. Lactation Consultation Note  Patient Name: Caroline Filbert BertholdChelsea Neidhardt ZOXWR'UToday's Date: 06/18/2017 Reason for consult: Initial assessment;Primapara;Late-preterm 34-36.6wks;Infant < 6lbs Breastfeeding consultation services and support information given to patient.  She also has at bedside Providing Breastmilk For Your Baby in NICU booklet.  Newborn is 7 hours old.  Mom is pumping for the second time and obtaining drops of colostrum.  She has been taught hand expression.  Reviewed pumping and answered questions.  Mom is planning on using a friends pump but [facility]  [facility].  Instructed to call for concerns/assist prn.  Maternal Data Has patient been taught Hand Expression?: Yes Does the patient have breastfeeding experience prior to this delivery?: No  Feeding    LATCH Score                   Interventions    Lactation Tools Discussed/Used WIC Program: Yes Pump Review: Setup, frequency, and cleaning;Milk Storage Initiated by:: RN Date initiated:: 06/18/17   Consult Status Consult Status: Follow-up Date: 06/19/17 Follow-up type: In-patient    Huston FoleyMOULDEN, Anacristina Steffek S 06/18/2017, 12:07 PM

## 2017-06-18 NOTE — Anesthesia Postprocedure Evaluation (Signed)
Anesthesia Post Note  Patient: Caroline BertholdChelsea Woods  Procedure(s) Performed: CESAREAN SECTION (N/A )     Patient location during evaluation: PACU Anesthesia Type: Spinal Level of consciousness: oriented and awake and alert Pain management: pain level controlled Vital Signs Assessment: post-procedure vital signs reviewed and stable Respiratory status: spontaneous breathing, respiratory function stable and patient connected to nasal cannula oxygen Cardiovascular status: blood pressure returned to baseline and stable Postop Assessment: no headache, no backache and no apparent nausea or vomiting Anesthetic complications: no    Last Vitals:  Vitals:   06/18/17 0631 06/18/17 0632  BP:  (!) 116/54  Pulse: (!) 103 (!) 106  Resp: (!) 22 17  Temp:    SpO2: 100% 100%    Last Pain:  Vitals:   06/18/17 0630  TempSrc: Oral  PainSc: 0-No pain   Pain Goal:                 Trevor IhaStephen A Houser

## 2017-06-18 NOTE — Progress Notes (Signed)
Patient with prolonged decel int he setting of placental abruption.  Discussed delivery via c-section. Risks, benefits and alternatives were explained including but not limited to risks of bleeding, infection and damage to adjacent organs. Patient verbalized understanding and all questions were answered. FHT: baseline 130, min mod variability, no accels, prolonged 6 minute decel Toco: no contractions

## 2017-06-18 NOTE — Op Note (Signed)
Caroline Woods PROCEDURE DATE: 06/17/2017 - 06/18/2017  PREOPERATIVE DIAGNOSIS: Intrauterine pregnancy at  4471w5d weeks gestation; non-reassuring fetal status and placental abruption  POSTOPERATIVE DIAGNOSIS: The same  PROCEDURE:     Cesarean Section  SURGEON:  Dr. Catalina AntiguaPeggy Anni Hocevar  ASSISTANT: Dr. Doroteo GlassmanPhelps  INDICATIONS: Caroline Woods is a 27 y.o. G1P0000 at 6571w5d scheduled for cesarean section secondary to abruptio placenta and non-reassuring fetal status.  The risks of cesarean section discussed with the patient included but were not limited to: bleeding which may require transfusion or reoperation; infection which may require antibiotics; injury to bowel, bladder, ureters or other surrounding organs; injury to the fetus; need for additional procedures including hysterectomy in the event of a life-threatening hemorrhage; placental abnormalities wth subsequent pregnancies, incisional problems, thromboembolic phenomenon and other postoperative/anesthesia complications. The patient concurred with the proposed plan, giving informed written consent for the procedure.    FINDINGS:  Viable female infant in cephalic presentation.  Apgars 1 and 7.  Clear amniotic fluid.  Intact placenta, three vessel cord. Approximately 30 % abruption noted.   Normal uterus, fallopian tubes and ovaries bilaterally. Cord pH 7.17  ANESTHESIA:    Spinal INTRAVENOUS FLUIDS:1200 ml ESTIMATED BLOOD LOSS: 1470 mL ml URINE OUTPUT:  150 ml SPECIMENS: Placenta sent to pathology COMPLICATIONS: None immediate  PROCEDURE IN DETAIL:  The patient received intravenous antibiotics and had sequential compression devices applied to her lower extremities while in the preoperative area.  She was then taken to the operating room where anesthesia was induced and was found to be adequate. A foley catheter was placed into her bladder and attached to [name] gravity. She was then placed in a dorsal supine position with a leftward tilt, and prepped and  draped in a sterile manner. After an adequate timeout was performed, a Pfannenstiel skin incision was made with scalpel and carried through to the underlying layer of fascia. The fascia was incised in the midline and this incision was extended bilaterally using the Mayo scissors. Kocher clamps were applied to the superior aspect of the fascial incision and the underlying rectus muscles were dissected off bluntly. A similar process was carried out on the inferior aspect of the facial incision. The rectus muscles were separated in the midline bluntly and the peritoneum was entered bluntly. The Alexis self-retaining retractor was introduced into the abdominal cavity. Attention was turned to the lower uterine segment where a  transverse hysterotomy was made with a scalpel and extended bilaterally bluntly. Approximately 200 cc of blood clots evaluated before delivery of infant. The infant was successfully delivered, and cord was clamped and cut and infant was handed over to awaiting neonatology team. Uterine massage was then administered and the placenta delivered intact with three-vessel cord. The uterus was cleared of clot and debris.  The hysterotomy was closed with 0 Vicryl in a running locked fashion, and an imbricating layer was also placed with a 0 Vicryl. Overall, excellent hemostasis was noted. The pelvis copiously irrigated and cleared of all clot and debris. Hemostasis was confirmed on all surfaces.  The peritoneum and the muscles were reapproximated using 0 vicryl interrupted stitches. The fascia was then closed using 0 Vicryl in a running fashion.  The subcutaneous layer was reapproximated with plain gut and the skin was closed in a subcuticular fashion using 3.0 Vicryl. The patient tolerated the procedure well. Sponge, lap, instrument and needle counts were correct x 2. She was taken to the recovery room in stable condition.    Desa Rech ConstantMD  06/18/2017 4:56  AM

## 2017-06-19 LAB — CBC
HEMATOCRIT: 20.4 % — AB (ref 36.0–46.0)
HEMOGLOBIN: 6.9 g/dL — AB (ref 12.0–15.0)
MCH: 31.4 pg (ref 26.0–34.0)
MCHC: 33.8 g/dL (ref 30.0–36.0)
MCV: 92.7 fL (ref 78.0–100.0)
Platelets: 264 10*3/uL (ref 150–400)
RBC: 2.2 MIL/uL — ABNORMAL LOW (ref 3.87–5.11)
RDW: 14.8 % (ref 11.5–15.5)
WBC: 13.1 10*3/uL — ABNORMAL HIGH (ref 4.0–10.5)

## 2017-06-19 MED ORDER — SODIUM CHLORIDE 0.9 % IV SOLN
510.0000 mg | Freq: Once | INTRAVENOUS | Status: AC
  Administered 2017-06-19: 510 mg via INTRAVENOUS
  Filled 2017-06-19: qty 17

## 2017-06-19 MED ORDER — FERROUS SULFATE 325 (65 FE) MG PO TABS
325.0000 mg | ORAL_TABLET | Freq: Two times a day (BID) | ORAL | Status: DC
Start: 2017-06-19 — End: 2017-06-21
  Administered 2017-06-19 – 2017-06-21 (×4): 325 mg via ORAL
  Filled 2017-06-19 (×5): qty 1

## 2017-06-19 NOTE — Anesthesia Postprocedure Evaluation (Signed)
Anesthesia Post Note  Patient: Filbert BertholdChelsea Sooy  Procedure(s) Performed: CESAREAN SECTION (N/A )     Patient location during evaluation: [facility] Anesthesia Type: Spinal Level of consciousness: oriented and awake and alert Pain management: pain level controlled Vital Signs Assessment: post-procedure vital signs reviewed and stable Respiratory status: spontaneous breathing and respiratory function stable Cardiovascular status: blood pressure returned to baseline and stable Postop Assessment: no headache, no backache and no apparent nausea or vomiting Anesthetic complications: no    Last Vitals:  Vitals:   06/19/17 0905 06/19/17 1558  BP: (!) 127/91 133/86  Pulse: (!) 116 (!) 113  Resp: 18 18  Temp: 36.9 C 36.8 C  SpO2: 99% 98%    Last Pain:  Vitals:   06/19/17 1800  TempSrc:   PainSc: 0-No pain   Pain Goal:                 Avyukth Bontempo

## 2017-06-19 NOTE — Progress Notes (Signed)
Subjective: Postpartum Day 1: Cesarean Delivery Patient reports incisional pain, tolerating PO and no problems voiding.   Ambulating without dizziness Objective: Vital signs in last 24 hours: Temp:  [97.8 F (36.6 C)-99.2 F (37.3 C)] 98.4 F (36.9 C) (03/25 0905) Pulse Rate:  [100-132] 116 (03/25 0905) Resp:  [17-19] 18 (03/25 0905) BP: (119-132)/(59-91) 127/91 (03/25 0905) SpO2:  [96 %-100 %] 99 % (03/25 0905)  Physical Exam:  General: alert, cooperative and no distress Lochia: appropriate Uterine Fundus: firm Incision: healing well, no significant drainage, no dehiscence DVT Evaluation: No evidence of DVT seen on physical exam.  Recent Labs    06/18/17 1123 06/19/17 0515  HGB 8.5* 6.9*  HCT 25.4* 20.4*    Assessment/Plan: Status post Cesarean section. Postoperative course complicated by anemia, tolerated well, will not transfuse at this time  Feraheme and iron PO.  Caroline Woods 06/19/2017, 11:50 AM

## 2017-06-19 NOTE — Addendum Note (Signed)
Addendum  created 06/19/17 1843 by Junious SilkGilbert, Ashey Tramontana, CRNA   Sign clinical note

## 2017-06-20 NOTE — Lactation Note (Signed)
This note was copied from a baby's chart. Lactation Consultation Note  Patient Name: Caroline Woods RUEAV'WToday's Date: 06/20/2017 Reason for consult: Follow-up assessment;1st time breastfeeding;Primapara;NICU baby;Late-preterm 34-36.6wks;Infant < 6lbs  Visited with P1 Mom of [facility] in the NICU.  Baby 56 hrs old.  Mom has been double pumping consistently every 3 hrs since yesterday.  Explained to Mom the benefits of >8 times per 24 hrs.  Explained that she can pump 1.5-2 hrs apart for a few pumpings, and then sleep tonight until she wakes. Encouraged STS as much as possible.  Breast massage and hand expression recommended.   Mom has a WIC pump.  Mom aware of Lactation services available. Discharged planned for tomorrow.   Interventions Interventions: Breast feeding basics reviewed;Skin to skin;Breast massage;Hand express;DEBP;Expressed milk  Lactation Tools Discussed/Used Tools: Pump Breast pump type: Double-Electric Breast Pump WIC Program: Yes   Consult Status Consult Status: Follow-up Date: 06/21/17 Follow-up type: In-patient    Caroline Woods, Caroline Woods 06/20/2017, 12:31 PM

## 2017-06-20 NOTE — Progress Notes (Signed)
Subjective: Postpartum Day 2: Cesarean Delivery Patient reports tolerating PO, + flatus and no problems voiding.    Objective: Vital signs in last 24 hours: Temp:  [98 F (36.7 C)-98.6 F (37 C)] 98.3 F (36.8 C) (03/26 0823) Pulse Rate:  [101-114] 101 (03/26 0823) Resp:  [16-18] 16 (03/26 0823) BP: (120-133)/(67-86) 130/67 (03/26 0823) SpO2:  [98 %-100 %] 100 % (03/26 16100823)  Physical Exam:  General: alert, cooperative and no distress Lochia: appropriate Uterine Fundus: firm Incision: healing well, no significant drainage, no significant erythema DVT Evaluation: No evidence of DVT seen on physical exam.  Recent Labs    06/18/17 1123 06/19/17 0515  HGB 8.5* 6.9*  HCT 25.4* 20.4*    Assessment/Plan: Status post Cesarean section. Doing well postoperatively.  Continue current care.  Scheryl DarterJames Arnold 06/20/2017, 9:41 AM

## 2017-06-21 DIAGNOSIS — Z98891 History of uterine scar from previous surgery: Secondary | ICD-10-CM

## 2017-06-21 MED ORDER — OXYCODONE-ACETAMINOPHEN 5-325 MG PO TABS: 1.0000 | ORAL_TABLET | Freq: Four times a day (QID) | ORAL | 0 refills | Status: DC | PRN

## 2017-06-21 MED ORDER — SENNOSIDES-DOCUSATE SODIUM 8.6-50 MG PO TABS: 1.0000 | ORAL_TABLET | Freq: Two times a day (BID) | ORAL | 3 refills | Status: DC

## 2017-06-21 MED ORDER — IBUPROFEN 600 MG PO TABS: 600.0000 mg | ORAL_TABLET | Freq: Four times a day (QID) | ORAL | 0 refills | Status: DC

## 2017-06-21 MED ORDER — FERROUS SULFATE 325 (65 FE) MG PO TABS: 325.0000 mg | ORAL_TABLET | Freq: Two times a day (BID) | ORAL | 3 refills | Status: DC

## 2017-06-21 NOTE — Clinical Social Work Maternal (Signed)
CLINICAL SOCIAL WORK MATERNAL/CHILD NOTE  Patient Details  Name: Caroline Woods MRN: [id number] Date of Birth: [birth year]  Date:  06/21/2017  Clinical Social Worker Initiating Note:  Terri Piedra, [place] Date/Time: Initiated:  06/20/17/1430     Child's Name:  Caroline Woods    Biological Parents:  Mother, Father(Caroline Woods and Caroline Woods)   Need for Interpreter:  None   Reason for Referral:  Parental Support of Premature Babies < 51 weeks/or Critically Ill babies   Address:  [address] [place] [address]    Phone number:  [phone] (home)     Additional phone number:  Household Members/Support Persons (HM/SP):   Household Member/Support Person 1   HM/SP Name Relationship DOB or Age  HM/SP -[place] mother    HM/SP -2        HM/SP -3        HM/SP -4        HM/SP -5        HM/SP -6        HM/SP -7        HM/SP -8          Natural Supports (not living in the home):  (MOB reports that her mother is her main support person.)   Professional Supports: None   Employment:     Type of Work: MOB works at [employer].  She states she was working at a [place] office as well, but it closed unexpectedly recently.   Education:      Homebound arranged:    Financial Resources:  Multimedia programmer   Other Resources:      Cultural/Religious Considerations Which May Impact Care: None stated.  MOB's facesheet notes religion as [place].  Strengths:  Ability to meet basic needs , Home prepared for child , Compliance with medical plan , Understanding of illness   Psychotropic Medications:         Pediatrician:       Pediatrician List:    [facility]      Pediatrician Fax Number:    Risk Factors/Current Problems:  None   Cognitive State:  Able to Concentrate , Alert , Linear Thinking , Insightful , Goal Oriented    Mood/Affect:  Bright ,  Interested , Calm    CSW Assessment: CSW met with MOB in her third floor room to introduce services, offer support and complete assessment due to baby's admission to NICU following abruption and Code Apgar.  MOB was polite and welcoming of CSW's visit.  She seemed receptive to sharing her story and discussing her current situation with CSW, and appeared appreciative of the opportunity to do so.  MOB shared her birth story and seemed fixated most on how difficult it was to stay on one side for 6 hours without moving.  She states, "I wouldn't wish this on my worst enemy."  She reports that she came in because she went to the bathroom and noticed that she had soaked herself with blood.  She told the story calmly and states she felt well cared for by her medical providers.  She added, "Dr. Elly Modena was awesome and really calm."  MOB talked about her pregnancy and told CSW that she was "in denial."  She states when she went for her annual exam, she thought she was pregnant, so she took a pregnancy  test before the appointment so that she could tell them that she thought she was pregnant.  Her pregnancy was confirmed at that appointment and she was shocked to find out that she was already 5 months along.  She states that she was happy after processing the reality of being pregnant and appears very excited about becoming a mother at this time.  She explained that her mother has been there for her throughout the entire experience and is her greatest support.  They live together in a home they inherited from her grandmother in [place].  They are interested in moving into a bigger place because she will be sharing a bedroom with baby and asked CSW for housing resources.  CSW provided some ideas, however, suggests that since they have stable and free housing currently, that she may consider settling in with baby before attempting to find new housing.  CSW notes that she can share a room with a child for quite some time  before a child needs his own room.  MOB thinks this is a good idea.   MOB seems to have a good understanding of baby's need for ICU care and states he is doing well, but not currently interested in nipple feeding yet.  She states she feels she has coped well with the whole experience so far, but became tearful when she states that she feels she is going to have difficulty when it comes to leaving the hospital without him tomorrow.  CSW validated her feelings and encouraged her to allow herself to be emotional.  CSW also encouraged her to re-frame her thinking and language around her discharge by changing "leaving the hospital without my baby" to "leaving the hospital before my baby."  CSW discussed ways to take this time to continue to prepare for baby at home and to care for herself to be emotionally and physically more prepared for taking baby home.  MOB seemed appreciative and agreed. MOB reports that she and FOB were in a relationship, but that he has not been involved since she informed him of the pregnancy.  She is "at peace with the situation" and wants to allow him to be involved if he chooses, but is okay parenting her son by herself if he does not.  She states they haven't talked much, but that he has been here since baby's birth.   MOB is concerned about finances as she states she was working two jobs until the job at a [place] office recently ended unexpectedly when the office closed.  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  She also works at [employer].  MOB thinks she will have everything she needs for baby by time of discharge, but understands to let CSW know if she has needs.  She was appreciative.  MOB states she does not drive, but that her mother will transport her to and from the hospital so that she can be with baby after her discharge.  She has come to terms with the fact that her baby will remain in the hospital longer than she will.   CSW discussed the  importance of monitoring emotions/PMADs signs and symptoms and explained ongoing support services offered by NICU CSW.  CSW provided MOB with contact information and asked her to call any time.  MOB thanked CSW and agreed.  CSW Plan/Description:  No Further Intervention Required/No Barriers to Discharge, Psychosocial Support and Ongoing Assessment of Needs, Sudden Infant Death Syndrome (SIDS) Education, Perinatal  Mood and Anxiety Disorder (PMADs) Education    Alphonzo Cruise, [place] 06/21/2017, 4:12 PM

## 2017-06-21 NOTE — Discharge Instructions (Signed)
Breast Pumping Tips  If you are breastfeeding, there may be times when you cannot feed your baby directly. Returning to work or going on a trip are examples. Pumping allows you to store breast milk and feed it to your baby later.  You may not get much milk when you first start to pump. Your breasts should start to make more after a few days. If you pump at the times you usually feed your baby, you may be able to keep making enough milk to feed your baby without also using formula. The more often you pump, the more milk your body will make.  When should I pump?  · You can start to pump soon after you have your baby. Ask your doctor what is right for you and your baby.  · If you are going back to work, start pumping a few weeks before. This gives you time to learn how to pump and to store a supply of milk.  · When you are with your baby, feed your baby when he or she is hungry. Pump after each feeding.  · When you are away from your baby for many hours, pump for about 15 minutes every 2-3 hours. Pump both breasts at the same time if you can.  · If your baby has a formula feeding, make sure to pump close to the same time.  · If you drink any alcohol, wait 2 hours before pumping.  How do I get ready to pump?  Your let-down reflex is your body's natural reaction that makes your breast milk flow. It is easier to make your breast milk flow when you are relaxed. Try these things to help you relax:  · Smell one of your baby's blankets or an item of clothing.  · Look at a picture or video of your baby.  · Sit in a quiet, private space.  · Massage the breast you plan to pump.  · Place soothing warmth on the breast.  · Play relaxing music.    What are some breast pumping tips?  · Wash your hands before you pump. You do not need to wash your nipples or breasts.  · There are three ways to pump. You can:  ? Use your hand to massage and squeeze your breast.  ? Use a handheld manual pump.  ? Use an electric pump.  · Make sure the  suction cup on the breast pump is the right size. Place the suction cup directly over the nipple. It can be painful or hurt your nipple if it is the wrong size or placed wrong.  · Put a small amount of purified or modified lanolin on your nipple and areola if you are sore.  · If you are using an electric pump, change the speed and suction power to be more comfortable.  · You may need a different type of pump if pumping hurts or you do not get a lot of milk. Your doctor can help you pick what type of pump to use.  · Keep a full water bottle near you always. Drinking lots of fluid helps you make more milk.  · You can store your milk to use later. Pumped breast milk can be stored in a sealable, sterile container or plastic bag. Always put the date you pumped it on the container.  ? Milk can stay out at room temperature for up to 8 hours.  ? You can store your milk in the refrigerator for up to   8 days.  ? You can store your milk in the freezer for 3 months. Thaw frozen milk using warm water. Do not put it in the microwave.  · Do not smoke. Ask your doctor for help.  When should I call my doctor?  · You have a hard time pumping.  · You are worried you do not make enough milk.  · You have nipple pain, soreness, or redness.  · You want to take birth control pills.  This information is not intended to replace advice given to you by your health care provider. Make sure you discuss any questions you have with your health care provider.  Document Released: 08/31/2007 Document Revised: 08/20/2015 Document Reviewed: 01/04/2013  Elsevier Interactive Patient Education © 2017 Elsevier Inc.

## 2017-06-21 NOTE — Lactation Note (Signed)
This note was copied from a baby's chart. Lactation Consultation Note  Patient Name: Caroline Filbert BertholdChelsea Hogate RUEAV'WToday's Date: 06/21/2017  Mom pumped 6 mls this morning and very excited.  WIC has provided her with a symphony pump.  Instructed to continue to pump and hand express 8-12 times/24 hours.  Encouraged to call with questions.   Maternal Data    Feeding Feeding Type: Donor Breast Milk Nipple Type: Slow - flow Length of feed: 15 min  LATCH Score                   Interventions    Lactation Tools Discussed/Used     Consult Status      Huston FoleyMOULDEN, Shatisha Falter S 06/21/2017, 12:21 PM

## 2017-06-21 NOTE — Discharge Summary (Signed)
OB Discharge Summary     Patient Name: Caroline BertholdChelsea Woods DOB: 12/29/1990 MRN: [id number]  Date of admission: 06/17/2017 Delivering MD: Catalina AntiguaONSTANT, PEGGY   Date of discharge: 06/21/2017  Admitting diagnosis: 35 WKS, BLEEDING Intrauterine pregnancy: 8171w5d     Secondary diagnosis:  Active Problems:   Late prenatal care affecting pregnancy   Supervision of other normal pregnancy, antepartum   Obesity affecting pregnancy, antepartum   Obesity, Class II, BMI 35-39.9   Elevated BP without diagnosis of hypertension   Vaginal bleeding in pregnancy, third trimester   Antepartum placental abruption   S/P primary low transverse C-section  Additional problems: none     Discharge diagnosis: Preterm Pregnancy Delivered                                                                                                Post partum procedures:none  Augmentation: NA  Complications: Placental Abruption  Hospital course:  Emergent C/S   27 y.o. yo G1P0101 at 5771w5d was admitted to the hospital 06/17/2017 for scheduled cesarean section with the following indication:Non-Reassuring FHR.  Membrane Rupture Time/Date: 4:24 AM ,06/18/2017   Patient delivered a Viable infant.06/18/2017  Details of operation can be found in separate operative note.  Pateint had an uncomplicated postpartum course.  She is ambulating, tolerating a regular diet, passing flatus, and urinating well. Patient is discharged home in stable condition on  06/21/17         Physical exam  Vitals:   06/20/17 1650 06/20/17 1935 06/20/17 2335 06/21/17 0800  BP: 131/79 131/76 (!) 112/55 115/68  Pulse: (!) 108 100 (!) 101 (!) 109  Resp: 20 18 17 18   Temp: 98 F (36.7 C) 98.4 F (36.9 C) 97.8 F (36.6 C) 98.2 F (36.8 C)  TempSrc: Oral Oral Oral Oral  SpO2: 100% 100% 97% 100%  Weight:      Height:       General: alert, cooperative and no distress Lochia: appropriate Uterine Fundus: firm Incision: Healing well with no significant  drainage, No significant erythema, Dressing is clean, dry, and intact DVT Evaluation: No evidence of DVT seen on physical exam. Labs: Lab Results  Component Value Date   WBC 13.1 (H) 06/19/2017   HGB 6.9 (LL) 06/19/2017   HCT 20.4 (L) 06/19/2017   MCV 92.7 06/19/2017   PLT 264 06/19/2017   CMP Latest Ref Rng & Units 06/18/2017  Glucose 65 - 99 mg/dL -  BUN 6 - 20 mg/dL -  Creatinine 7.820.44 - 9.561.00 mg/dL 2.130.56  Sodium 086135 - 578145 mmol/L -  Potassium 3.5 - 5.1 mmol/L -  Chloride 101 - 111 mmol/L -  CO2 22 - 32 mmol/L -  Calcium 8.9 - 10.3 mg/dL -  Total Protein 6.5 - 8.1 g/dL -  Total Bilirubin 0.3 - 1.2 mg/dL -  Alkaline Phos 38 - 469126 U/L -  AST 15 - 41 U/L -  ALT 14 - 54 U/L -    Discharge instruction: per After Visit Summary and "Baby and Me Booklet".  After visit meds:  Allergies as of 06/21/2017   No Known Allergies  Medication List    STOP taking these medications   acetaminophen 500 MG tablet Commonly known as:  TYLENOL     TAKE these medications   ferrous sulfate 325 (65 FE) MG tablet Take 1 tablet (325 mg total) by mouth 2 (two) times daily with a meal.   ibuprofen 600 MG tablet Commonly known as:  ADVIL,MOTRIN Take 1 tablet (600 mg total) by mouth every 6 (six) hours.   lisdexamfetamine 50 MG capsule Commonly known as:  VYVANSE Take 50 mg by mouth 3 (three) times a week. Patient takes MWF.   oxyCODONE-acetaminophen 5-325 MG tablet Commonly known as:  PERCOCET/ROXICET Take 1 tablet by mouth every 6 (six) hours as needed for severe pain.   prenatal multivitamin Tabs tablet Take 1 tablet by mouth daily at 12 noon.   senna-docusate 8.6-50 MG tablet Commonly known as:  Senokot-S Take 1 tablet by mouth 2 (two) times daily.            Discharge Care Instructions  (From admission, onward)        Start     Ordered   06/21/17 0000  Discharge wound care:    Comments:  In the shower, let soapy water drip on the wound (don't scrub). Pat it dry. If  your skin folds over the incision, put a cloth pad on it to keep it from getting sweaty. Within a week, your wound will be mostly healed. But look out for issues: If you develop a fever or if the skin surrounding the incision turns red, it starts oozing green or pus-colored liquid, or it becomes hard or painful, call your doctor. These could be signs of infection.  You can remove the dressing completely in 7-10 days after your C-section. You may want to put another bandage on to protect your clothes   06/21/17 1213      Diet: routine diet  Activity: Advance as tolerated. Pelvic rest for 6 weeks.   Outpatient follow up:2 weeks for wound check and 6 weeks for routine pp visit Follow up Appt:No future appointments. Follow up Visit:No follow-ups on file.  Postpartum contraception: Depo Provera  Newborn Data: Live born female  Birth Weight: 5 lb 1.5 oz (2310 g) APGAR: 1, 7  Newborn Delivery   Birth date/time:  06/18/2017 04:26:00 Delivery type:  C-Section, Low Transverse C-section categorization:  Primary     Baby Feeding: Breast Disposition:NICU   06/21/2017 Federico Flake, MD

## 2017-06-21 NOTE — Progress Notes (Signed)
Pt ambulated out teaching complete  

## 2017-06-22 ENCOUNTER — Encounter: Payer: BLUE CROSS/BLUE SHIELD | Admitting: Family Medicine

## 2017-06-22 ENCOUNTER — Encounter (HOSPITAL_COMMUNITY): Payer: Self-pay

## 2017-06-22 ENCOUNTER — Ambulatory Visit (HOSPITAL_COMMUNITY): Payer: BLUE CROSS/BLUE SHIELD

## 2017-06-30 ENCOUNTER — Ambulatory Visit: Payer: Self-pay

## 2017-06-30 NOTE — Lactation Note (Signed)
This note was copied from a baby's chart. Lactation Consultation Note  Patient Name: Caroline Woods WUJWJ'XToday's Date: 06/30/2017 Reason for consult: Follow-up assessment;Primapara;1st time breastfeeding;NICU baby;Late-preterm 34-36.6wks;Infant < 6lbs;Difficult latch  6412 days old early term female, NICU baby is being fed breastmilk through tube/gavage; mom attempted feeding him at the breast last night without any success.  Mom's nipples look intact upon examination, right breast seem to have more compressible tissue than left one. Baby was eager to fed, after mom changed his diaper he started to actively cue. Tried to latch baby on with the naked breast but it didn't work out; Retail bankerbaby didn't latch. However, he was able to latch on right breast on cross cradle hold using a NS # 20; baby had a 15 minutes feeding before unlatching himself off, swallows were hears; breastmilk was noted on the NS as well. Mom and grandma were very pleased, praised mom for her efforts.  When switching breast, mom tried the # 16 NS on the other breast, nipple is smaller, but the tissue was challenging as not as compressible when doing hand expression to put a little bit of milk inside the NS. After mom burped baby, he did not latch on the other breast (with or without the shield, tried football and cross cradle). Mom kept baby STS and RN started gavage feeding for baby; it was his regular time.  Recommended mom to try the # 20 NS on both breasts the next time baby is ready to BF. Mom will start wearing breast shells to help evert her nipples and make the areola more compressible. Mom will start BF baby on cues, but baby is still going to get his feedings through gavage tube. Mom is aware not to push feedings attempts if baby is not ready to BF, explained to mom that BF is baby led. Mom is aware of LC services and will call for latch assistance if/when needed.  Maternal Data    Feeding Feeding Type: Breast Fed Length of feed: 15  min  LATCH Score Latch: Repeated attempts needed to sustain latch, nipple held in mouth throughout feeding, stimulation needed to elicit sucking reflex.(with NS # 20)  Audible Swallowing: A few with stimulation  Type of Nipple: Everted at rest and after stimulation(short shaft nipple)  Comfort (Breast/Nipple): Soft / non-tender  Hold (Positioning): Assistance needed to correctly position infant at breast and maintain latch.(lactation consultant full assist)  LATCH Score: 7  Interventions Interventions: Breast feeding basics reviewed;Assisted with latch;Skin to skin;Breast massage;Hand express;Adjust position;Support pillows;Position options;Hand pump;Shells;Breast compression(nipple shield)  Lactation Tools Discussed/Used Tools: Nipple Dorris CarnesShields;Shells;Pump Nipple shield size: 16;20 Shell Type: Inverted Breast pump type: Double-Electric Breast Pump   Consult Status Consult Status: Follow-up Date: 07/01/17 Follow-up type: In-patient    Caroline Woods 06/30/2017, 6:39 PM

## 2017-07-03 ENCOUNTER — Ambulatory Visit: Payer: BLUE CROSS/BLUE SHIELD

## 2017-07-03 VITALS — BP 133/85

## 2017-07-03 DIAGNOSIS — Z5189 Encounter for other specified aftercare: Secondary | ICD-10-CM

## 2017-07-03 NOTE — Progress Notes (Signed)
Subjective:     Caroline BertholdChelsea Woods is a 27 y.o. female who presents to the clinic two  weeks status post c-section delivery. . Eating a regular diet without difficulty. Bowel movements are normal. Pain level in normal at this time.   Review of Systems   Objective:    BP 133/85   LMP 12/25/2016 (LMP Unknown)  General:  normal  Abdomen:  soreness   Incision:   no drainage,redness     Assessment:    Incision looks normal, no redness or irritation at this time.    Plan:    1. Continue any current medications. 2. Wound care discussed. 3. Activity restrictions: no work for approximately six weeks 4. Anticipated return to work: after postpartum check around May 5. Follow up: May for postpartum.   Maylin Freeburg Cheree DittoGraham, RMA

## 2017-07-06 ENCOUNTER — Ambulatory Visit: Payer: Self-pay

## 2017-07-06 NOTE — Lactation Note (Signed)
This note was copied from a baby's chart. Lactation Consultation Note  Patient Name: Caroline Filbert BertholdChelsea Rundle WUJWJ'XToday's Date: 07/06/2017    [facility]:  RN called to ask for lactation support for this mother.  Filbert Bertholdhelsea Kruk called LC office this a.m. due to concerns with her breasts staying "warm" since yesterday.  Her baby is now 352 weeks old and is in the NICU.  [facility].  She is now here in the NICU and still has concerns so LC went to NICU to evaluate. She voiced concerns about her milk volume decreasing approximately 50% since yesterday along with the continued warm breasts. She denies a fever, lumps, engorgement, pain or any other unusual symptoms.  She states they just don't feel "normal."    LC entered the pumping room where she was pumping the right breast with our DEBP.  The milk was not flowing and LC assessed flange size.  It was apparent that mother was using a flange size that was too small.  LC switched from a size #24 to a #27 which mother stated felt much better.  Also informed mother about using coconut oil as a flange lubricant.  It was noted that mother was also still pumping using the initiation setting on the DEBP.  Explained and showed her that she no longer had to use the initiation setting and could go straight to the regular setting.  Discussed with her about massaging during pumping to help increase milk volume.  LC suggested a hands free bra and/or a sports bra with cut out holes to become more effective with pumping.  Explained in detail how she could make one with a sports bra that she currently has at home.  Upon visual and manual assessment, mother's breasts are heavy and firm but not engorged.  There is no nipple or areola trauma or breakdown.  There are visible veins on the breasts.    Upon further questioning, mother stated that she was told she had short shaft nipples and that her baby could not breastfed.  I did  not see this and asked if she wanted to breastfeed rather than pumping and bottle feeding.  She stated that she would definitely prefer to exclusively breastfeed. LC showed her that her nipples were erect after pumping and that it should be possible to latch baby. Since [facility] not aware of how the infant's medical condition is progressing I suggested she discuss this with her NICU nurse in the a.m. and let them know her desire to put baby to breast.  She will plan to do this in the a.m.  She has also made an OB visit for late tomorrow morning.  I suggested she keep this visit for now and continue to pump with the correct flange size on the correct setting every 2-3 hours to see if her milk volume begins to increase.  She will be observing her breasts and the warmth tonight, her milk volume, her comfort level and will be deciding in the a.m. If she wants to visit with her OB.  LC suggested that if she has any concerns at all it would be advisable to keep her visit.  She wants to come back tomorrow to give an update and to let me know how she feels.  I advised her to call tomorrow when she visits her baby.  No further questions at this time.  Nabiha Planck R Tawonna Esquer 07/06/2017, 7:03 PM

## 2017-07-07 ENCOUNTER — Ambulatory Visit: Payer: BLUE CROSS/BLUE SHIELD | Admitting: Obstetrics and Gynecology

## 2017-07-26 ENCOUNTER — Encounter: Payer: Self-pay | Admitting: Family Medicine

## 2017-07-26 ENCOUNTER — Ambulatory Visit (INDEPENDENT_AMBULATORY_CARE_PROVIDER_SITE_OTHER): Payer: BLUE CROSS/BLUE SHIELD | Admitting: Family Medicine

## 2017-07-26 DIAGNOSIS — Z1389 Encounter for screening for other disorder: Secondary | ICD-10-CM

## 2017-07-26 DIAGNOSIS — Z98891 History of uterine scar from previous surgery: Secondary | ICD-10-CM

## 2017-07-26 DIAGNOSIS — Z789 Other specified health status: Secondary | ICD-10-CM

## 2017-07-26 DIAGNOSIS — Z3046 Encounter for surveillance of implantable subdermal contraceptive: Secondary | ICD-10-CM | POA: Diagnosis not present

## 2017-07-26 DIAGNOSIS — B372 Candidiasis of skin and nail: Secondary | ICD-10-CM

## 2017-07-26 DIAGNOSIS — Z30017 Encounter for initial prescription of implantable subdermal contraceptive: Secondary | ICD-10-CM

## 2017-07-26 DIAGNOSIS — R87612 Low grade squamous intraepithelial lesion on cytologic smear of cervix (LGSIL): Secondary | ICD-10-CM

## 2017-07-26 MED ORDER — ETONOGESTREL 68 MG ~~LOC~~ IMPL
68.0000 mg | DRUG_IMPLANT | Freq: Once | SUBCUTANEOUS | Status: AC
  Administered 2017-07-26: 68 mg via SUBCUTANEOUS

## 2017-07-26 MED ORDER — NYSTATIN 100000 UNIT/GM EX POWD: Freq: Four times a day (QID) | CUTANEOUS | 0 refills | Status: DC

## 2017-07-26 NOTE — Progress Notes (Signed)
Post Partum Exam  Caroline Woods is a 27 y.o. G34P0101 female who presents for a postpartum visit. She is four weeks postpartum following a low transverse C-section delivery on 07/18/2017. I have fully reviewed the prenatal and intrapartum course. The delivery was at 35 gestational weeks.  Anesthesia: Epidual. Postpartum course has been uncomplicated.  Baby's course has been complicated by NICU stay and weight gain. Baby is feeding by pumped breastmilk and a small neosure supplement per peds. Bleeding not at this time. Bowel function is normal. Bladder function is normal. Patient is not sexually active. Contraception method is nothing at this time but would like to discuss depo-provera and other methods. Postpartum depression screening:negative   Pumping 8-12 times per day.  Making about 16-18oz per day. Infant is gaining weight.   Last pap smear done  Normal on 04/19/2017   Review of Systems Pertinent items are noted in HPI.    Objective:  Last menstrual period 12/25/2016, unknown if currently breastfeeding.  General:  alert, cooperative and appears stated age   Breasts:  inspection negative, no nipple discharge or bleeding, no masses or nodularity palpable  Lungs: clear to auscultation bilaterally  Heart:  regular rate and rhythm, S1, S2 normal, no murmur, click, rub or gallop  Abdomen: soft, non-tender; bowel sounds normal; no masses,  no organomegaly   Vulva:  not evaluated  Vagina: not evaluated  Cervix:  not evaluated  Corpus: not examined  Adnexa:  not evaluated  Rectal Exam: Not performed.    Nexplanon Insertion Procedure Patient identified, informed consent performed, consent signed.   Patient does understand that irregular bleeding is a very common side effect of this medication. She was advised to have backup contraception for one week after placement. Pregnancy test in clinic today was negative.  Appropriate time out taken.  Patient's left arm was prepped and draped in the usual  sterile fashion.. The ruler used to measure and mark insertion area.  Patient was prepped with alcohol swab and then injected with 3 ml of 1% lidocaine.  She was prepped with betadine, Nexplanon removed from packaging,  Device confirmed in needle, then inserted full length of needle and withdrawn per handbook instructions. Nexplanon was able to palpated in the patient's arm; patient palpated the insert herself. There was minimal blood loss.  Patient insertion site covered with guaze and a pressure bandage to reduce any bruising.  The patient tolerated the procedure well and was given post procedure instructions.   Assessment:    Normal postpartum exam. Pap smear not done at today's visit.   Plan:   1. Contraception: Nexplanon- placed today 2. Infant feeding- exclusively pumping! 100% mom's milk except for MD recommended neosure. Has appt in June and if weight gain appropriate mom wants to stop supplement. reivewed BF resources and showed her the website [employer] 3. Chronic Medical:  Obesity- recommended weight loss before next pregnancy Abnormal paps- next in Jan 2020   Follow up in: 1 year or as needed.

## 2018-03-05 ENCOUNTER — Encounter (HOSPITAL_COMMUNITY): Payer: Self-pay | Admitting: Emergency Medicine

## 2018-03-05 ENCOUNTER — Ambulatory Visit (HOSPITAL_COMMUNITY)
Admission: EM | Admit: 2018-03-05 | Discharge: 2018-03-05 | Disposition: A | Discharge status: Home / Self Care | Payer: Medicaid Other | Attending: Family Medicine | Admitting: Family Medicine

## 2018-03-05 ENCOUNTER — Other Ambulatory Visit: Payer: Self-pay

## 2018-03-05 DIAGNOSIS — J020 Streptococcal pharyngitis: Secondary | ICD-10-CM | POA: Diagnosis not present

## 2018-03-05 LAB — POCT RAPID STREP A: Streptococcus, Group A Screen (Direct): POSITIVE — AB

## 2018-03-05 MED ORDER — AMOXICILLIN 500 MG PO CAPS: 500.0000 mg | ORAL_CAPSULE | Freq: Three times a day (TID) | ORAL | 0 refills | Status: AC

## 2018-03-05 MED ORDER — IBUPROFEN 600 MG PO TABS: 600.0000 mg | ORAL_TABLET | Freq: Four times a day (QID) | ORAL | 0 refills | Status: DC | PRN

## 2018-03-05 MED ORDER — ACETAMINOPHEN 325 MG PO TABS: 650.0000 mg | ORAL_TABLET | Freq: Once | ORAL | Status: DC

## 2018-03-05 MED ORDER — AMOXICILLIN 500 MG PO CAPS: 500.0000 mg | ORAL_CAPSULE | Freq: Three times a day (TID) | ORAL | 0 refills | Status: DC

## 2018-03-05 NOTE — ED Triage Notes (Signed)
Uri symptoms that started today.  Symptoms include.  Stuffy nose, chills and sore throat.  Denies fever

## 2018-03-05 NOTE — Discharge Instructions (Addendum)

## 2018-03-07 NOTE — ED Provider Notes (Signed)
[facility]    CSN: [id number] Arrival date & time: 03/05/18  1840     History   Chief Complaint Chief Complaint  Patient presents with  . URI    HPI Caroline Woods is a 27 y.o. female history of childhood asthma presenting today for evaluation of URI symptoms.  She has had congestion, sore throat as well as chills.  Symptoms began earlier today.  She denies known fevers until arrival today.  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  She is also had some body aches.  She has not taken anything for her symptoms.  HPI  Past Medical History:  Diagnosis Date  . ADHD (attention deficit hyperactivity disorder)   . Childhood asthma   . 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 Vaginal Pap smear, abnormal 2014   Unknown result    Patient Active Problem List   Diagnosis Date Noted  . History of C-section 06/21/2017  . Obesity, Class II, BMI 35-39.9 04/19/2017  . 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 CESAREAN SECTION N/A 06/18/2017   Procedure: CESAREAN SECTION;  Surgeon: Catalina Antigua, MD;  Location: [facility];  Service: Obstetrics;  Laterality: N/A;  . NO PAST SURGERIES      OB History    Gravida  1   Para  1   Term  0   Preterm  1   AB  0   Living  1     SAB  0   TAB  0   Ectopic  0   Multiple  0   Live Births  1            Home Medications    Prior to Admission medications   Medication Sig Start Date End Date Taking? Authorizing Provider  amoxicillin (AMOXIL) 500 MG capsule Take 1 capsule (500 mg total) by mouth 3 (three) times daily for 10 days. 03/05/18 03/15/18  Keevin Panebianco C, PA-C  ferrous sulfate 325 (65 FE) MG tablet Take 1 tablet (325 mg total) by mouth 2 (two) times daily  with a meal. 06/21/17   Federico Flake, MD  ibuprofen (ADVIL,MOTRIN) 600 MG tablet Take 1 tablet (600 mg total) by mouth every 6 (six) hours as needed. 03/05/18   Lakresha Stifter C, PA-C  lisdexamfetamine (VYVANSE) 50 MG capsule Take 50 mg by mouth 3 (three) times a week. Patient takes MWF.    [provider]  Prenatal Vit-Fe Fumarate-FA (PRENATAL MULTIVITAMIN) TABS tablet Take 1 tablet by mouth daily at 12 noon.    [provider]  senna-docusate (SENOKOT-S) 8.6-50 MG tablet Take 1 tablet by mouth 2 (two) times daily. 06/21/17   Federico Flake, MD    Family History Family History  Problem Relation Age of Onset  . Hypertension Mother   . Kidney cancer Maternal Grandfather     Social History Social History   Tobacco Use  . Smoking status: Never Smoker  . Smokeless tobacco: Never Used  Substance Use Topics  . Alcohol use: No  . Drug use: No     Allergies   Patient has no known allergies.   Review of Systems Review of Systems  Constitutional: Positive for chills and fatigue. Negative for activity change,  appetite change and fever.  HENT: Positive for congestion, rhinorrhea and sore throat. Negative for ear pain, sinus pressure and trouble swallowing.   Eyes: Negative for discharge and redness.  Respiratory: Negative for cough, chest tightness and shortness of breath.   Cardiovascular: Negative for chest pain.  Gastrointestinal: Negative for abdominal pain, diarrhea, nausea and vomiting.  Musculoskeletal: Positive for myalgias.  Skin: Negative for rash.  Neurological: Negative for dizziness, light-headedness and headaches.     Physical Exam Triage Vital Signs ED Triage Vitals  Enc Vitals Group     BP 03/05/18 2002 118/71     Pulse Rate 03/05/18 2002 (!) 129     Resp 03/05/18 2002 18     Temp 03/05/18 2002 (!) 101.9 F (38.8 C)     Temp Source 03/05/18 2002 Oral     SpO2 03/05/18 2002 99 %     Weight --      Height --      Head  Circumference --      Peak Flow --      Pain Score 03/05/18 2000 0     Pain Loc --      Pain Edu? --      Excl. in GC? --    No data found.  Updated Vital Signs BP 118/71 (BP Location: Right Arm)   Pulse (!) 129   Temp (!) 101.9 F (38.8 C) (Oral)   Resp 18   SpO2 99%   Visual Acuity Right Eye Distance:   Left Eye Distance:   Bilateral Distance:    Right Eye Near:   Left Eye Near:    Bilateral Near:     Physical Exam  Constitutional: She appears well-developed and well-nourished. No distress.  HENT:  Head: Normocephalic and atraumatic.  Bilateral ears without tenderness to palpation of external auricle, tragus and mastoid, EAC's without erythema or swelling, TM's with good bony landmarks and cone of light. Non erythematous.  Oral mucosa pink and moist, mild tonsillar enlargement with erythema. Posterior pharynx patent and nonerythematous, no uvula deviation or swelling. Normal phonation.   Eyes: Conjunctivae are normal.  Neck: Neck supple.  Cardiovascular: Regular rhythm.  No murmur heard. Tachycardic  Pulmonary/Chest: Effort normal and breath sounds normal. No respiratory distress.  Breathing comfortably at rest, CTABL, no wheezing, rales or other adventitious sounds auscultated  Abdominal: Soft. There is no tenderness.  Musculoskeletal: She exhibits no edema.  Neurological: She is alert.  Skin: Skin is warm and dry.  Psychiatric: She has a normal mood and affect.  Nursing note and vitals reviewed.    UC Treatments / Results  Labs (all labs ordered are listed, but only abnormal results are displayed) Labs Reviewed  POCT RAPID STREP A - Abnormal; Notable for the following components:      Result Value   Streptococcus, Group A Screen (Direct) POSITIVE (*)    All other components within normal limits  CULTURE, GROUP A STREP [facility])    EKG None  Radiology No results found.  Procedures Procedures (including critical care time)  Medications Ordered in  UC Medications - No data to display  Initial Impression / Assessment and Plan / UC Course  I have reviewed the triage vital signs and the nursing notes.  Pertinent labs & imaging results that were available during my care of the patient were reviewed by me and considered in my medical decision making (see chart for details).     Strep positive, will treat with amoxicillin.  Will recommend further  symptomatic management of sore throat as well as symptoms, anti-inflammatories for body aches and chills and fever.  Continue to monitor for resolution of symptoms, follow-up if not resolving.Discussed strict return precautions. Patient verbalized understanding and is agreeable with plan.  Final Clinical Impressions(s) / UC Diagnoses   Final diagnoses:  Streptococcal sore throat     Discharge Instructions     Sore Throat  Your rapid strep test was positive today. We will treat you for strep throat with an antibiotic. Please take Amoxicillin as prescribed.   Please continue Tylenol or Ibuprofen for fever and pain. May try salt water gargles, cepacol lozenges, throat spray, or OTC cold relief medicine for throat discomfort. If you also have congestion take a daily anti-histamine like Zyrtec, Claritin, and a oral decongestant to help with post nasal drip that may be irritating your throat.   Stay hydrated and drink plenty of fluids to keep your throat coated relieve irritation.     ED Prescriptions    Medication Sig Dispense Auth. Provider   amoxicillin (AMOXIL) 500 MG capsule  (Status: Discontinued) Take 1 capsule (500 mg total) by mouth 3 (three) times daily for 10 days. 30 capsule Jebidiah Baggerly C, PA-C   ibuprofen (ADVIL,MOTRIN) 600 MG tablet  (Status: Discontinued) Take 1 tablet (600 mg total) by mouth every 6 (six) hours as needed. 30 tablet Gearlene Godsil C, PA-C   amoxicillin (AMOXIL) 500 MG capsule Take 1 capsule (500 mg total) by mouth 3 (three) times daily for 10 days. 30 capsule  Anaika Santillano C, PA-C   ibuprofen (ADVIL,MOTRIN) 600 MG tablet Take 1 tablet (600 mg total) by mouth every 6 (six) hours as needed. 30 tablet Zakyah Yanes, MarcellineHallie C, PA-C     Controlled Substance Prescriptions [place] Controlled Substance Registry consulted? Not Applicable   Lew DawesWieters, Kristena Wilhelmi C, [place]-C 03/07/18 1018

## 2018-05-08 ENCOUNTER — Other Ambulatory Visit: Payer: Self-pay

## 2018-05-08 ENCOUNTER — Ambulatory Visit (HOSPITAL_COMMUNITY)
Admission: EM | Admit: 2018-05-08 | Discharge: 2018-05-08 | Disposition: A | Discharge status: Home / Self Care | Payer: Medicaid Other | Attending: Family Medicine | Admitting: Family Medicine

## 2018-05-08 ENCOUNTER — Encounter (HOSPITAL_COMMUNITY): Payer: Self-pay

## 2018-05-08 DIAGNOSIS — M545 Low back pain, unspecified: Secondary | ICD-10-CM

## 2018-05-08 MED ORDER — KETOROLAC TROMETHAMINE 30 MG/ML IJ SOLN
INTRAMUSCULAR | Status: AC
  Filled 2018-05-08: qty 1

## 2018-05-08 MED ORDER — KETOROLAC TROMETHAMINE 30 MG/ML IJ SOLN
30.0000 mg | Freq: Once | INTRAMUSCULAR | Status: AC
  Administered 2018-05-08: 30 mg via INTRAMUSCULAR

## 2018-05-08 MED ORDER — METHOCARBAMOL 500 MG PO TABS: 500.0000 mg | ORAL_TABLET | Freq: Two times a day (BID) | ORAL | 0 refills | Status: DC

## 2018-05-08 MED ORDER — MELOXICAM 7.5 MG PO TABS: 7.5000 mg | ORAL_TABLET | Freq: Every day | ORAL | 0 refills | Status: DC

## 2018-05-08 NOTE — ED Triage Notes (Signed)
Pt presents tio [facility] for lower back pain x1 month. Pt states she fell on ramp and hurt back, she also complains of pinching and tingling sensation.

## 2018-05-08 NOTE — ED Provider Notes (Signed)
[facility]    CSN: [id number] Arrival date & time: 05/08/18  1639     History   Chief Complaint Chief Complaint  Patient presents with  . Back Pain    HPI Caroline Woods is a 28 y.o. female.   28 year old female comes in for 1 month history of bilateral low back pain.  States about 1 month ago, she fell backwards, hitting her buttocks and back.  She denies pain immediately after fall.  States a week after the fall, started having bilateral low back pain that is intermittent, worse with movement, long hours of standing and sitting.  States it improves with laying down. Denies saddle anesthesia, loss of bladder or bowel control. Denies urinary symptoms such as frequency, dysuria, hematuria. Has not tried anything for the symptoms.      Past Medical History:  Diagnosis Date  . ADHD (attention deficit hyperactivity disorder)   . Childhood asthma   . 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 Vaginal Pap smear, abnormal 2014   Unknown result    Patient Active Problem List   Diagnosis Date Noted  . History of C-section 06/21/2017  . Obesity, Class II, BMI 35-39.9 04/19/2017  . 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 CESAREAN SECTION N/A 06/18/2017   Procedure: CESAREAN SECTION;  Surgeon: Catalina Antigua, MD;  Location: [facility];  Service: Obstetrics;  Laterality: N/A;  . NO PAST SURGERIES      OB History    Gravida  1   Para  1   Term  0   Preterm  1   AB  0   Living  1     SAB  0   TAB  0   Ectopic  0   Multiple  0   Live Births  1            Home Medications    Prior to Admission medications   Medication Sig Start Date End Date Taking? Authorizing Provider  Prenatal Vit-Fe Fumarate-FA (PRENATAL MULTIVITAMIN) TABS tablet  Take 1 tablet by mouth daily at 12 noon.   Yes [provider]  ferrous sulfate 325 (65 FE) MG tablet Take 1 tablet (325 mg total) by mouth 2 (two) times daily with a meal. 06/21/17   Federico Flake, MD  lisdexamfetamine (VYVANSE) 50 MG capsule Take 50 mg by mouth 3 (three) times a week. Patient takes MWF.    [provider]  meloxicam (MOBIC) 7.5 MG tablet Take 1 tablet (7.5 mg total) by mouth daily. 05/08/18   Cathie Hoops, Breck Maryland V, PA-C  methocarbamol (ROBAXIN) 500 MG tablet Take 1 tablet (500 mg total) by mouth 2 (two) times daily. 05/08/18   Cathie Hoops, Davin Archuletta V, PA-C  senna-docusate (SENOKOT-S) 8.6-50 MG tablet Take 1 tablet by mouth 2 (two) times daily. 06/21/17   Federico Flake, MD    Family History Family History  Problem Relation Age of Onset  . Hypertension Mother   . Kidney cancer Maternal Grandfather     Social History Social History   Tobacco Use  . Smoking status: Never Smoker  . Smokeless tobacco: Never Used  Substance Use Topics  . Alcohol use: No  . Drug use: No     Allergies   Patient has no known allergies.  Review of Systems Review of Systems  Reason unable to perform ROS: See HPI as above.     Physical Exam Triage Vital Signs ED Triage Vitals  Enc Vitals Group     BP 05/08/18 1754 123/83     Pulse Rate 05/08/18 1754 (!) 106     Resp --      Temp 05/08/18 1754 97.6 F (36.4 C)     Temp Source 05/08/18 1754 Oral     SpO2 05/08/18 1754 98 %     Weight --      Height --      Head Circumference --      Peak Flow --      Pain Score 05/08/18 1756 7     Pain Loc --      Pain Edu? --      Excl. in GC? --    No data found.  Updated Vital Signs BP 123/83 (BP Location: Left Arm)   Pulse (!) 106   Temp 97.6 F (36.4 C) (Oral)   SpO2 98%   Physical Exam Constitutional:      General: She is not in acute distress.    Appearance: She is well-developed. She is not ill-appearing, toxic-appearing or diaphoretic.  HENT:     Head:  Normocephalic and atraumatic.  Eyes:     Conjunctiva/sclera: Conjunctivae normal.     Pupils: Pupils are equal, round, and reactive to light.  Cardiovascular:     Rate and Rhythm: Normal rate and regular rhythm.     Heart sounds: Normal heart sounds. No murmur. No friction rub. No gallop.   Pulmonary:     Effort: Pulmonary effort is normal. No accessory muscle usage or respiratory distress.     Breath sounds: Normal breath sounds. No stridor. No decreased breath sounds, wheezing, rhonchi or rales.  Musculoskeletal:     Comments: No tenderness on palpation of the spinous processes. Tenderness to palpation of bilateral paraspinal muscles along L4-L5 region. No tenderness to palpation of hips. Full range of motion of back and hips. Strength normal and equal bilaterally. Sensation intact and equal bilaterally. Negative straight leg raise.  Skin:    General: Skin is warm and dry.  Neurological:     Mental Status: She is alert and oriented to person, place, and time.    UC Treatments / Results  Labs (all labs ordered are listed, but only abnormal results are displayed) Labs Reviewed - No data to display  EKG None  Radiology No results found.  Procedures Procedures (including critical care time)  Medications Ordered in UC Medications  ketorolac (TORADOL) 30 MG/ML injection 30 mg (30 mg Intramuscular Given 05/08/18 1835)    Initial Impression / Assessment and Plan / UC Course  I have reviewed the triage vital signs and the nursing notes.  Pertinent labs & imaging results that were available during my care of the patient were reviewed by me and considered in my medical decision making (see chart for details).    Patient sitting comfortably, no hesitation with movements. Exam reassuring. Will provide toradol injection for pain. NSAIDs, muscle relaxant as needed. Return precautions given. Patient expresses understanding and agrees to plan.  Final Clinical Impressions(s) / UC  Diagnoses   Final diagnoses:  Acute bilateral low back pain without sciatica    ED Prescriptions    Medication Sig Dispense Auth. Provider   meloxicam (MOBIC) 7.5 MG tablet Take 1 tablet (7.5 mg total) by mouth daily. 15 tablet Belinda FisherYu, Laneka Mcgrory V, PA-C  methocarbamol (ROBAXIN) 500 MG tablet Take 1 tablet (500 mg total) by mouth 2 (two) times daily. 20 tablet Threasa AlphaYu, Cherly Erno V, PA-C        Justyn Langham V, [place] 05/08/18 1842

## 2018-05-08 NOTE — Discharge Instructions (Signed)
Toradol injection in office today. Start Mobic. Do not take ibuprofen (motrin/advil)/ naproxen (aleve) while on mobic. Robaxin as needed, this can make you drowsy, so do not take if you are going to drive, operate heavy machinery, or make important decisions. Ice/heat compresses as needed. This can take up to 3-4 weeks to completely resolve, but you should be feeling better each week. Follow up here or with PCP if symptoms worsen, changes for reevaluation. If experience numbness/tingling of the inner thighs, loss of bladder or bowel control, go to the emergency department for evaluation.

## 2018-06-20 ENCOUNTER — Ambulatory Visit: Payer: Medicaid Other | Admitting: Family Medicine

## 2019-05-06 IMAGING — US US MFM OB FOLLOW-UP
1 series · 14 of 28 positions shown · non-contrast
Comparison: none

[Series 1: us mfm ob follow-up · 60 acquisitions, 14 frames shown]
[im 3/60]
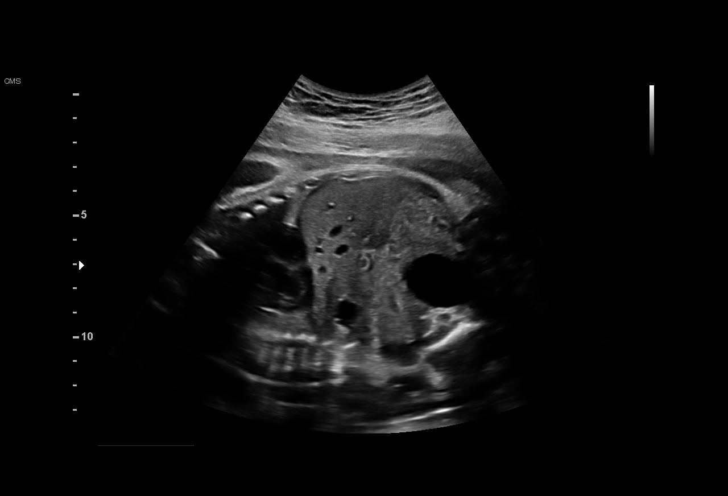
[im 7/60]
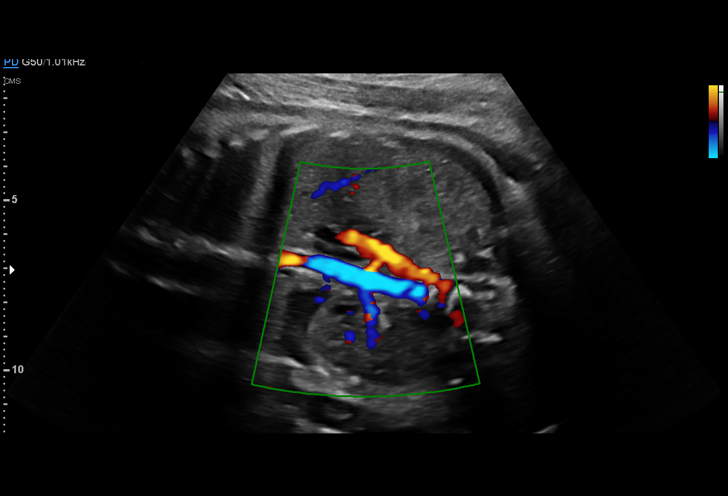
[im 11/60]
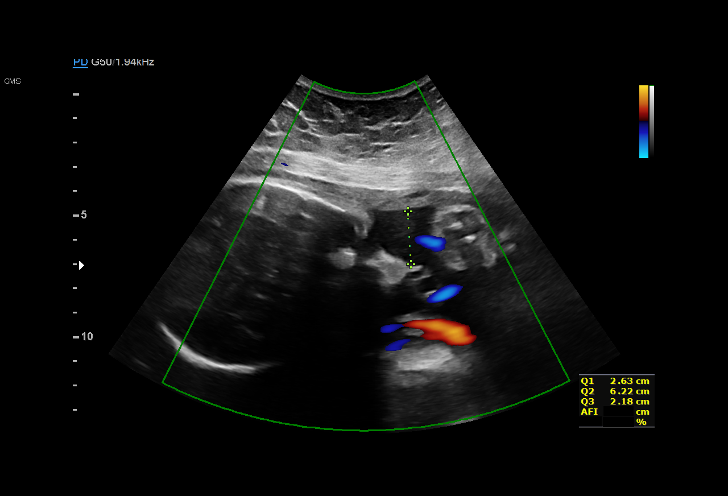
[im 16/60]
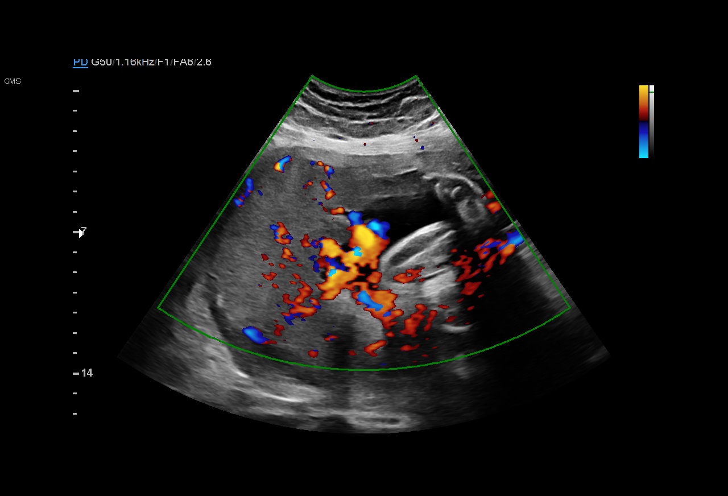
[im 20/60]
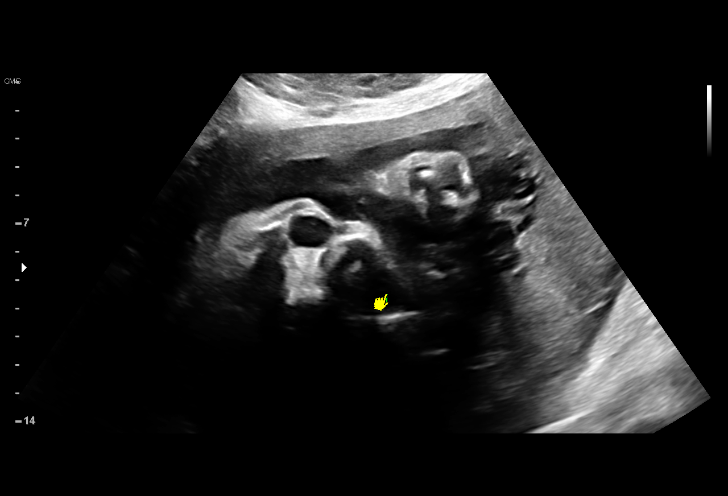
[im 25/60]
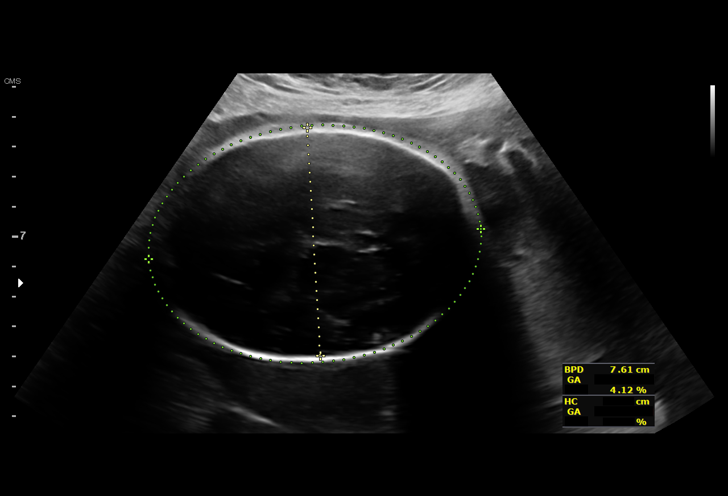
[im 29/60]
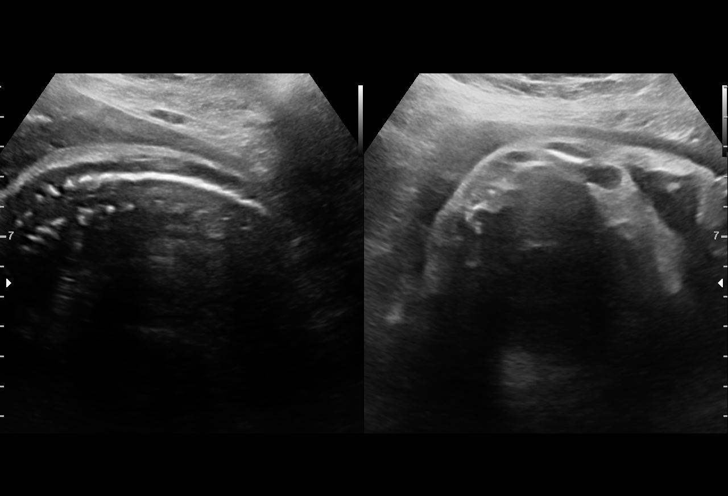
[im 33/60]
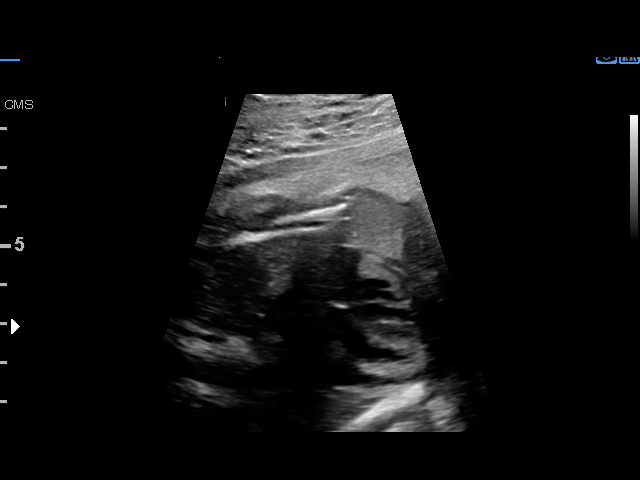
[im 38/60]
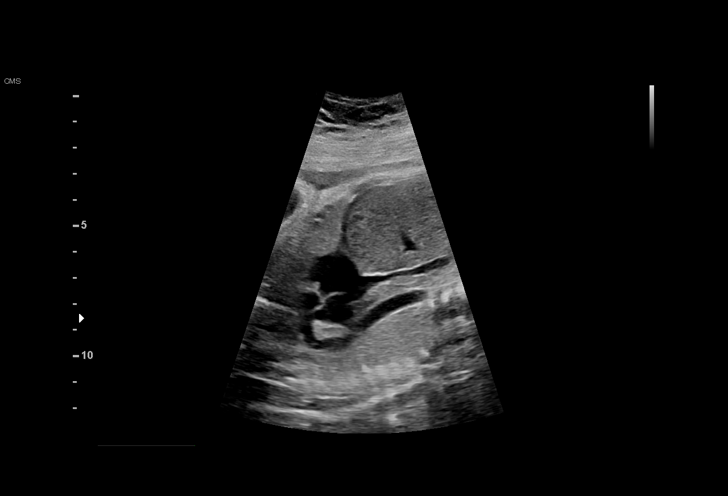
[im 42/60]
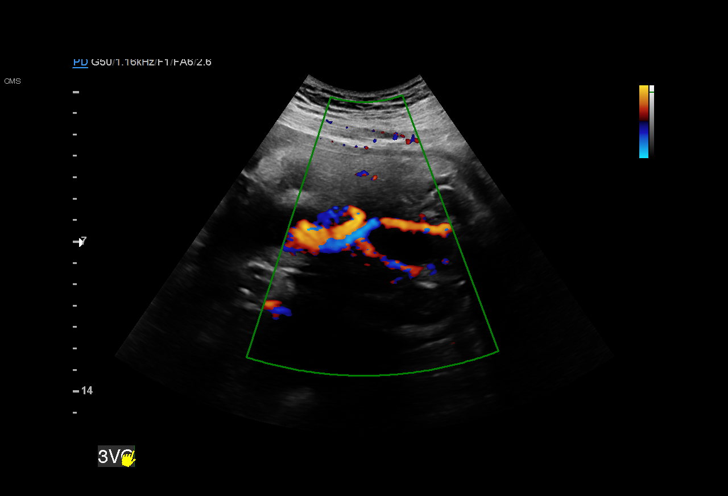
[im 46/60]
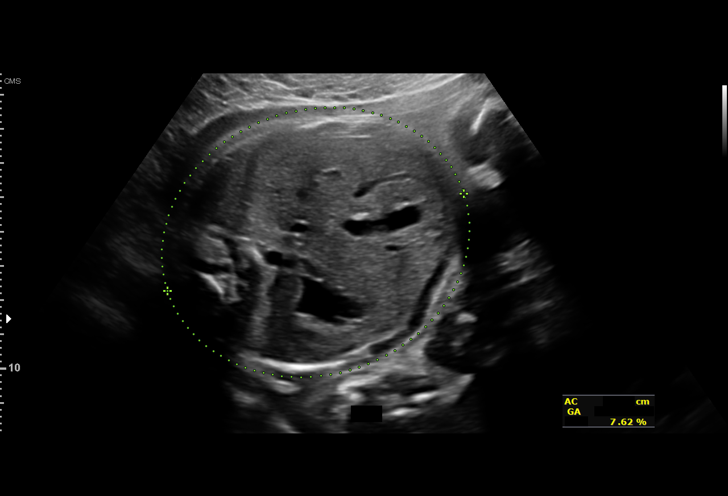
[im 51/60]
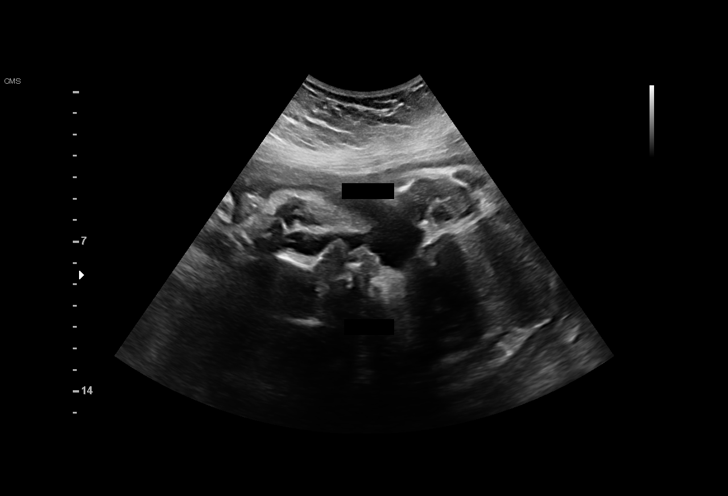
[im 55/60]
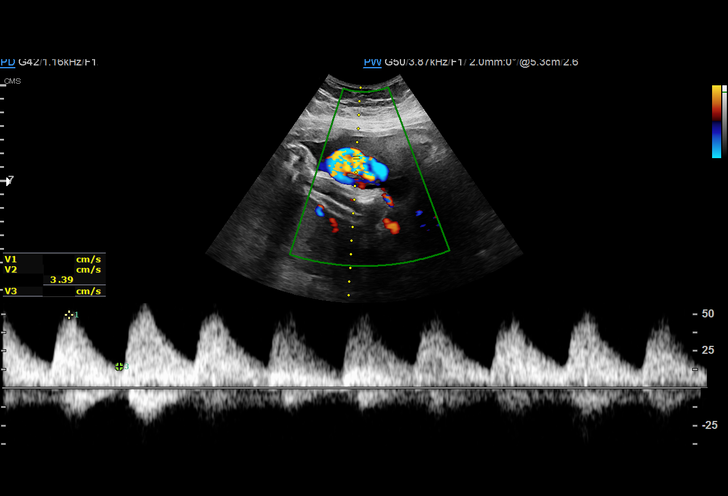
[im 60/60]
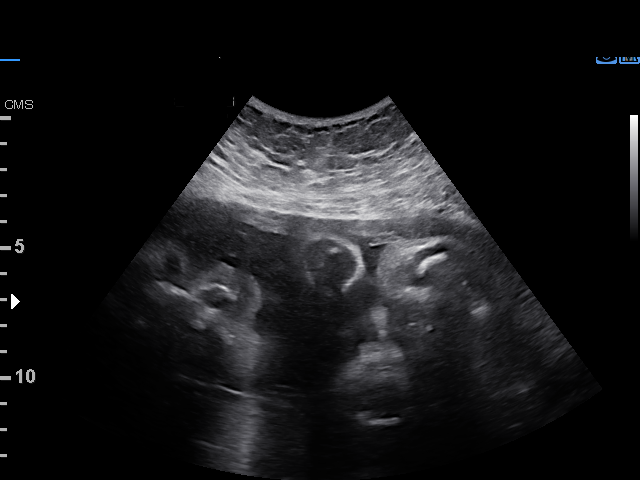

[14 of 28 positions shown; findings below may reference images not displayed]

1  MUMUN YACHEVA            [id number]      [id number]     [id number]
Indications

32 weeks gestation of pregnancy
Late to prenatal care, third trimester
Obesity complicating pregnancy, third
trimester; BMI
Antenatal follow-up for nonvisualized fetal
anatomy
OB History

Gravidity:    1
Fetal Evaluation

Num Of Fetuses:     1
Fetal Heart         153
Rate(bpm):
Cardiac Activity:   Observed
Presentation:       Breech
Placenta:           Posterior, above cervical os
P. Cord Insertion:  Visualized

Amniotic Fluid
AFI FV:      Subjectively within normal limits

AFI Sum(cm)     %Tile       Largest Pocket(cm)
11.03           24

RUQ(cm)                     LUQ(cm)        LLQ(cm)
2.63
Biometry

BPD:      76.1  mm     G. Age:  30w 4d          5  %    CI:        66.04   %    70 - 86
FL/HC:      19.1   %    19.1 -
HC:      300.4  mm     G. Age:  33w 2d         40  %    HC/AC:      1.13        0.96 -
AC:      266.5  mm     G. Age:  30w 5d         12  %    FL/BPD:     75.3   %    71 - 87
FL:       57.3  mm     G. Age:  30w 0d        < 3  %    FL/AC:      21.5   %    20 - 24
HUM:      53.1  mm     G. Age:  31w 0d         25  %

Est. FW:    4199  gm    3 lb 10 oz      27  %
Gestational Age

U/S Today:     31w 1d                                        EDD:   07/26/17
Best:          32w 2d     Det. By:  U/S  (04/27/17)          EDD:   07/18/17
Anatomy

Cranium:               Appears normal         Aortic Arch:            Not well visualized
Cavum:                 Appears normal         Ductal Arch:            Previously seen
Ventricles:            Appears normal         Diaphragm:              Appears normal
Choroid Plexus:        Appears normal         Stomach:                Appears normal, left
sided
Cerebellum:            Previously seen        Abdomen:                Appears normal
Posterior Fossa:       Previously seen        Abdominal Wall:         Previously seen
Nuchal Fold:           Not applicable (>20    Cord Vessels:           Appears normal (3
wks GA)                                        vessel cord)
Face:                  Orbits nl; profile not Kidneys:                Appear normal
well visualized
Lips:                  Appears normal         Bladder:                Appears normal
Thoracic:              Appears normal         Spine:                  Not well visualized
Heart:                 Appears normal         Upper Extremities:      Visualized
(4CH, axis, and situs
RVOT:                  Appears normal         Lower Extremities:      Previously seen
LVOT:                  Appears normal

Other:  Fetus appears to be a male. Heels previously visualized. Technically
difficult due to advanced gestational age.
Cervix Uterus Adnexa

Cervix
Not visualized (advanced GA >21wks)

Uterus
No abnormality visualized.

Left Ovary
Not visualized.

Right Ovary
Not visualized.

Cul De Sac:   No free fluid seen.

Adnexa:       No abnormality visualized.
Impression

Single living intrauterine pregnancy at 32w 2d.
Placenta Posterior, above cervical os.
Appropriate fetal growth.
Normal amniotic fluid volume.
The fetal anatomic survey is not complete.
No gross fetal anomalies identified.
Recommendations

Recommend follow-up ultrasound examination in 4 weeks for
reassessment of fetal growth and anatomy.

## 2019-06-13 ENCOUNTER — Ambulatory Visit: Payer: Medicaid Other | Attending: Family

## 2019-06-13 DIAGNOSIS — Z23 Encounter for immunization: Secondary | ICD-10-CM

## 2019-06-13 NOTE — Progress Notes (Signed)
   Covid-19 Vaccination Clinic  Name:  Caroline Woods    MRN: [id number] DOB: 1990/07/03  06/13/2019  [name] was observed post Covid-19 immunization for 15 minutes without incident. She was provided with Vaccine Information Sheet and instruction to access the V-Safe system.   [name] was instructed to call 911 with any severe reactions post vaccine: Marland Kitchen Difficulty breathing  . Swelling of face and throat  . A fast heartbeat  . A bad rash all over body  . Dizziness and weakness   Immunizations Administered    Name Date Dose VIS Date Route   Moderna COVID-19 Vaccine 06/13/2019  4:20 PM 0.5 mL 02/26/2019 Intramuscular   Manufacturer: Moderna   Lot: [id number]   NDC: 28003-491-79

## 2019-07-16 ENCOUNTER — Ambulatory Visit: Payer: Medicaid Other | Attending: Family

## 2019-07-16 DIAGNOSIS — Z23 Encounter for immunization: Secondary | ICD-10-CM

## 2019-07-16 NOTE — Progress Notes (Signed)
   Covid-19 Vaccination Clinic  Name:  Caroline Woods    MRN: [id number] DOB: Sep 23, 1990  07/16/2019  [name] was observed post Covid-19 immunization for 15 minutes without incident. She was provided with Vaccine Information Sheet and instruction to access the V-Safe system.   [name] was instructed to call 911 with any severe reactions post vaccine: Marland Kitchen Difficulty breathing  . Swelling of face and throat  . A fast heartbeat  . A bad rash all over body  . Dizziness and weakness   Immunizations Administered    Name Date Dose VIS Date Route   Moderna COVID-19 Vaccine 07/16/2019  4:07 PM 0.5 mL 02/2019 Intramuscular   Manufacturer: Moderna   Lot: [id number]   NDC: 83151-761-60

## 2019-11-26 ENCOUNTER — Other Ambulatory Visit: Payer: Self-pay

## 2019-11-26 ENCOUNTER — Encounter: Payer: Self-pay | Admitting: Obstetrics and Gynecology

## 2019-11-26 ENCOUNTER — Other Ambulatory Visit (HOSPITAL_COMMUNITY)
Admission: RE | Admit: 2019-11-26 | Discharge: 2019-11-26 | Disposition: A | Discharge status: Home / Self Care | Payer: BC Managed Care – PPO | Source: Ambulatory Visit | Attending: Obstetrics and Gynecology | Admitting: Obstetrics and Gynecology

## 2019-11-26 ENCOUNTER — Ambulatory Visit (INDEPENDENT_AMBULATORY_CARE_PROVIDER_SITE_OTHER): Payer: BC Managed Care – PPO | Admitting: Obstetrics and Gynecology

## 2019-11-26 VITALS — BP 141/86 | HR 102 | Wt 210.4 lb

## 2019-11-26 DIAGNOSIS — N912 Amenorrhea, unspecified: Secondary | ICD-10-CM | POA: Diagnosis not present

## 2019-11-26 DIAGNOSIS — Z01419 Encounter for gynecological examination (general) (routine) without abnormal findings: Secondary | ICD-10-CM | POA: Insufficient documentation

## 2019-11-26 NOTE — Progress Notes (Signed)
Subjective:     Caroline Woods is a 29 y.o. female P1 and is here for a comprehensive physical exam. The patient reports no problems. Patient is sexually active using Nexplanon which was placed 07/2017. She reports amenorrhea with Nexplanon. Patient denies any pelvic pain or abnormal discharge. Patient reports occasional episodes of night sweats. Patient without any other complaints  Past Medical History:  Diagnosis Date  . ADHD (attention deficit hyperactivity disorder)   . Childhood asthma   . 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 Vaginal Pap smear, abnormal 2014   Unknown result   Past Surgical History:  Procedure Laterality Date  . CESAREAN SECTION N/A 06/18/2017   Procedure: CESAREAN SECTION;  Surgeon: 06/20/2017, MD;  Location: [facility];  Service: Obstetrics;  Laterality: N/A;  . NO PAST SURGERIES     Family History  Problem Relation Age of Onset  . Hypertension Mother   . Kidney cancer Maternal Grandfather     Social History   Socioeconomic History  . Marital status: Single    Spouse name: Not on file  . Number of children: Not on file  . Years of education: Not on file  . Highest education level: Not on file  Occupational History  . Not on file  Tobacco Use  . Smoking status: Never Smoker  . Smokeless tobacco: Never Used  Substance and Sexual Activity  . Alcohol use: No  . Drug use: No  . Sexual activity: Yes    Partners: Male    Birth control/protection: None  Other Topics Concern  . Not on file  Social History Narrative  . Not on file   Social Determinants of Health   Financial Resource Strain:   . Difficulty of Paying Living Expenses: Not on file  Food Insecurity:   . Worried About [place] in the Last Year: Not on file  . Ran Out of Food in the Last Year: Not on file  Transportation Needs:   . Lack of Transportation  (Medical): Not on file  . Lack of Transportation (Non-Medical): Not on file  Physical Activity:   . Days of Exercise per Week: Not on file  . Minutes of Exercise per Session: Not on file  Stress:   . Feeling of Stress : Not on file  Social Connections:   . Frequency of Communication with Friends and Family: Not on file  . Frequency of Social Gatherings with Friends and Family: Not on file  . Attends Religious Services: Not on file  . Active Member of Clubs or Organizations: Not on file  . Attends Programme researcher, broadcasting/film/video Meetings: Not on file  . Marital Status: Not on file  Intimate Partner Violence:   . Fear of Current or Ex-Partner: Not on file  . Emotionally Abused: Not on file  . Physically Abused: Not on file  . Sexually Abused: Not on file   Health Maintenance  Topic Date Due  . Hepatitis C Screening  Never done  . PAP SMEAR-Modifier  04/19/2018  . INFLUENZA VACCINE  10/27/2019  . PAP-Cervical Cytology Screening  04/19/2020  . TETANUS/TDAP  05/19/2027  . COVID-19 Vaccine  Completed  . HIV Screening  Completed       Review of Systems Pertinent items noted in HPI and remainder of comprehensive ROS otherwise negative.   Objective:  Blood pressure (!) 141/86, pulse 05/21/2027)  102, weight 210 lb 6.4 oz (95.4 kg), unknown if currently breastfeeding.     GENERAL: Well-developed, well-nourished female in no acute distress.  HEENT: Normocephalic, atraumatic. Sclerae anicteric.  NECK: Supple. Normal thyroid.  LUNGS: Clear to auscultation bilaterally.  HEART: Regular rate and rhythm. BREASTS: Symmetric in size. No palpable masses or lymphadenopathy, skin changes, or nipple drainage. ABDOMEN: Soft, nontender, nondistended. No organomegaly. PELVIC: Normal external female genitalia. Vagina is pink and rugated.  Normal discharge. Normal appearing cervix. Uterus is normal in size. No adnexal mass or tenderness. EXTREMITIES: No cyanosis, clubbing, or edema, 2+ distal pulses.     Assessment:    Healthy female exam.      Plan:    Pap smear collected TSH collected Patient will be contacted with abnormal results See After Visit Summary for Counseling Recommendations

## 2019-11-27 LAB — TSH: TSH: 2.43 u[IU]/mL (ref 0.450–4.500)

## 2019-11-28 LAB — CYTOLOGY - PAP: Diagnosis: NEGATIVE

## 2019-12-23 ENCOUNTER — Ambulatory Visit (INDEPENDENT_AMBULATORY_CARE_PROVIDER_SITE_OTHER): Payer: BC Managed Care – PPO

## 2019-12-23 ENCOUNTER — Other Ambulatory Visit: Payer: Self-pay

## 2019-12-23 ENCOUNTER — Other Ambulatory Visit (HOSPITAL_COMMUNITY)
Admission: RE | Admit: 2019-12-23 | Discharge: 2019-12-23 | Disposition: A | Discharge status: Home / Self Care | Payer: BC Managed Care – PPO | Source: Ambulatory Visit | Attending: Obstetrics & Gynecology | Admitting: Obstetrics & Gynecology

## 2019-12-23 VITALS — BP 131/83 | HR 101 | Ht 61.0 in | Wt 207.4 lb

## 2019-12-23 DIAGNOSIS — N898 Other specified noninflammatory disorders of vagina: Secondary | ICD-10-CM | POA: Diagnosis not present

## 2019-12-23 DIAGNOSIS — N760 Acute vaginitis: Secondary | ICD-10-CM | POA: Diagnosis not present

## 2019-12-23 DIAGNOSIS — B9689 Other specified bacterial agents as the cause of diseases classified elsewhere: Secondary | ICD-10-CM | POA: Diagnosis not present

## 2019-12-23 NOTE — Progress Notes (Signed)
..  SUBJECTIVE:  29 y.o. female complains of vaginal itching for 3 days. Denies abnormal vaginal bleeding or significant pelvic pain or fever. No UTI symptoms. Denies history of known exposure to STD.  No LMP recorded (lmp unknown). Patient has had an implant.  OBJECTIVE:  She appears well, afebrile. Urine dipstick: not done.  ASSESSMENT:  Vaginal Itching    PLAN:  GC, chlamydia, trichomonas, BVAG, CVAG probe sent to lab. Treatment: To be determined once lab results are received ROV prn if symptoms persist or worsen.

## 2019-12-24 ENCOUNTER — Telehealth: Payer: Self-pay

## 2019-12-24 ENCOUNTER — Ambulatory Visit: Payer: BC Managed Care – PPO

## 2019-12-24 LAB — CERVICOVAGINAL ANCILLARY ONLY
Bacterial Vaginitis (gardnerella): POSITIVE — AB
Candida Glabrata: NEGATIVE
Candida Vaginitis: NEGATIVE
Chlamydia: NEGATIVE
Comment: NEGATIVE
Comment: NEGATIVE
Comment: NEGATIVE
Comment: NEGATIVE
Comment: NEGATIVE
Comment: NORMAL
Neisseria Gonorrhea: NEGATIVE
Trichomonas: NEGATIVE

## 2019-12-24 MED ORDER — METRONIDAZOLE 500 MG PO TABS: 500.0000 mg | ORAL_TABLET | Freq: Two times a day (BID) | ORAL | 0 refills | Status: AC

## 2019-12-24 NOTE — Telephone Encounter (Signed)
Called patient and informed patient of results. Explained this is not an STI as she was concerned it was. Patient voiced understanding and will be picking medication up from pharmacy.

## 2019-12-24 NOTE — Telephone Encounter (Signed)
-----   Message from Tereso Newcomer, MD sent at 12/24/2019  1:40 PM EDT ----- Vaginal discharge test is abnormal and showed bacterial vaginitis. Metronidazole prescribed.  Please inform patient of results, advise to pick up prescribed medication and take as directed.

## 2019-12-24 NOTE — Addendum Note (Signed)
Addended by: Jaynie Collins A on: 12/24/2019 01:40 PM   Modules accepted: Orders

## 2020-01-29 ENCOUNTER — Other Ambulatory Visit: Payer: Self-pay

## 2020-01-29 ENCOUNTER — Encounter: Payer: Self-pay | Admitting: Emergency Medicine

## 2020-01-29 ENCOUNTER — Ambulatory Visit
Admission: EM | Admit: 2020-01-29 | Discharge: 2020-01-29 | Disposition: A | Discharge status: Home / Self Care | Payer: BC Managed Care – PPO | Attending: Physician Assistant | Admitting: Physician Assistant

## 2020-01-29 DIAGNOSIS — R058 Other specified cough: Secondary | ICD-10-CM

## 2020-01-29 DIAGNOSIS — J209 Acute bronchitis, unspecified: Secondary | ICD-10-CM | POA: Diagnosis not present

## 2020-01-29 DIAGNOSIS — Z20822 Contact with and (suspected) exposure to covid-19: Secondary | ICD-10-CM | POA: Diagnosis not present

## 2020-01-29 MED ORDER — PREDNISONE 50 MG PO TABS: 50.0000 mg | ORAL_TABLET | Freq: Every day | ORAL | 0 refills | Status: DC

## 2020-01-29 MED ORDER — BENZONATATE 200 MG PO CAPS: 200.0000 mg | ORAL_CAPSULE | Freq: Three times a day (TID) | ORAL | 0 refills | Status: DC

## 2020-01-29 MED ORDER — ALBUTEROL SULFATE HFA 108 (90 BASE) MCG/ACT IN AERS: 1.0000 | INHALATION_SPRAY | Freq: Four times a day (QID) | RESPIRATORY_TRACT | 0 refills | Status: DC | PRN

## 2020-01-29 NOTE — ED Triage Notes (Signed)
Nagging cough for 1 week. Denies shortness of breath, fever, congestion.

## 2020-01-29 NOTE — Discharge Instructions (Addendum)
COVID PCR testing ordered. I would like you to quarantine until testing results. Start prednisone as directed. Tessalon for cough. Albuterol as needed for cough/shortness of breath with activity. If having worsening symptoms, shortness of breath, trouble breathing, go to the emergency department for further evaluation.

## 2020-01-29 NOTE — ED Provider Notes (Signed)
[facility]    CSN: [id number] Arrival date & time: 01/29/20  1613      History   Chief Complaint Chief Complaint  Patient presents with   Cough    HPI Preeya Cleckley is a 29 y.o. female.   29 year old female comes in for 1 week history of URI symptoms. Cough, nasal congestion. Cough is worse during the day during ambulation/talking. Denies fever, chills, body aches. Denies abdominal pain, nausea, vomiting, diarrhea. Denies shortness of breath, loss of taste/smell. otc cough medicine without relief. Never smoker. Had asthma as a child.      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   8/16: W/ Positive higher risk HPV [ ]  Repeat Pap in one year   Vaginal Pap smear, abnormal 2014   Unknown result    Patient Active Problem List   Diagnosis Date Noted   History of C-section 06/21/2017   Obesity, Class II, BMI 35-39.9 04/19/2017   Low grade squamous intraepithelial lesion (LGSIL) on cervical Pap smear 11/11/2014    Past Surgical History:  Procedure Laterality Date   CESAREAN SECTION N/A 06/18/2017   Procedure: CESAREAN SECTION;  Surgeon: 06/20/2017, MD;  Location: [facility];  Service: Obstetrics;  Laterality: N/A;   NO PAST SURGERIES      OB History    Gravida  1   Para  1   Term  0   Preterm  1   AB  0   Living  1     SAB  0   TAB  0   Ectopic  0   Multiple  0   Live Births  1            Home Medications    Prior to Admission medications   Medication Sig Start Date End Date Taking? Authorizing Provider  albuterol (VENTOLIN HFA) 108 (90 Base) MCG/ACT inhaler Inhale 1-2 puffs into the lungs every 6 (six) hours as needed for wheezing or shortness of breath. 01/29/20   13/3/21, Pritika Alvarez V, PA-C  benzonatate (TESSALON) 200 MG capsule Take 1 capsule (200 mg total) by mouth  every 8 (eight) hours. 01/29/20   13/3/21, Shalon Salado V, PA-C  predniSONE (DELTASONE) 50 MG tablet Take 1 tablet (50 mg total) by mouth daily with breakfast. 01/29/20   13/3/21, Lenka Zhao V, PA-C  ferrous sulfate 325 (65 FE) MG tablet Take 1 tablet (325 mg total) by mouth 2 (two) times daily with a meal. Patient not taking: Reported on 12/23/2019 06/21/17 01/29/20  13/3/21, MD  lisdexamfetamine (VYVANSE) 50 MG capsule Take 50 mg by mouth 3 (three) times a week. Patient takes MWF. Patient not taking: Reported on 12/23/2019  01/29/20  [provider]    Family History Family History  Problem Relation Age of Onset   Hypertension Mother    Kidney cancer Maternal Grandfather     Social History Social History   Tobacco Use   Smoking status: Never Smoker   Smokeless tobacco: Never Used  Substance Use Topics   Alcohol use: No   Drug use: No     Allergies   Patient has no known allergies.   Review of Systems Review of Systems  Reason unable to perform ROS: See HPI as above.     Physical Exam Triage Vital Signs ED Triage Vitals  Enc Vitals  Group     BP 01/29/20 1631 128/88     Pulse Rate 01/29/20 1631 (!) 102     Resp 01/29/20 1631 16     Temp 01/29/20 1631 98.2 F (36.8 C)     Temp Source 01/29/20 1631 Oral     SpO2 01/29/20 1631 98 %     Weight --      Height --      Head Circumference --      Peak Flow --      Pain Score 01/29/20 1629 0     Pain Loc --      Pain Edu? --      Excl. in GC? --    No data found.  Updated Vital Signs BP 128/88    Pulse (!) 102    Temp 98.2 F (36.8 C) (Oral)    Resp 16    SpO2 98%   Physical Exam Constitutional:      General: She is not in acute distress.    Appearance: Normal appearance. She is not ill-appearing, toxic-appearing or diaphoretic.  HENT:     Head: Normocephalic and atraumatic.     Mouth/Throat:     Mouth: Mucous membranes are moist.     Pharynx: Oropharynx is clear. Uvula midline.  Cardiovascular:     Rate  and Rhythm: Normal rate and regular rhythm.     Heart sounds: Normal heart sounds. No murmur heard.  No friction rub. No gallop.   Pulmonary:     Effort: Pulmonary effort is normal. No accessory muscle usage, prolonged expiration, respiratory distress or retractions.     Comments: LCTAB. Frequent cough throughout conversation Musculoskeletal:     Cervical back: Normal range of motion and neck supple.  Skin:    General: Skin is warm and dry.  Neurological:     General: No focal deficit present.     Mental Status: She is alert and oriented to person, place, and time.      UC Treatments / Results  Labs (all labs ordered are listed, but only abnormal results are displayed) Labs Reviewed  NOVEL CORONAVIRUS, NAA    EKG   Radiology No results found.  Procedures Procedures (including critical care time)  Medications Ordered in UC Medications - No data to display  Initial Impression / Assessment and Plan / UC Course  I have reviewed the triage vital signs and the nursing notes.  Pertinent labs & imaging results that were available during my care of the patient were reviewed by me and considered in my medical decision making (see chart for details).    COVID PCR test ordered. Patient to quarantine until testing results return. No alarming signs on exam. LCTAB. Patient with frequent bronchitic cough during exam, worse with talking/activity. Prednisone as directed. Symptomatic treatment discussed.  Push fluids.  Return precautions given.  Patient expresses understanding and agrees to plan.  Final Clinical Impressions(s) / UC Diagnoses   Final diagnoses:  Cough with exposure to COVID-19 virus  Acute bronchitis, unspecified organism   ED Prescriptions    Medication Sig Dispense Auth. Provider   predniSONE (DELTASONE) 50 MG tablet Take 1 tablet (50 mg total) by mouth daily with breakfast. 5 tablet Abbegail Matuska V, PA-C   albuterol (VENTOLIN HFA) 108 (90 Base) MCG/ACT inhaler Inhale  1-2 puffs into the lungs every 6 (six) hours as needed for wheezing or shortness of breath. 8 g Kelcey Korus V, PA-C   benzonatate (TESSALON) 200 MG capsule Take 1 capsule (  200 mg total) by mouth every 8 (eight) hours. 21 capsule Belinda Fisher, PA-C     PDMP not reviewed this encounter.   Belinda Fisher, PA-C 01/29/20 1707

## 2020-01-30 LAB — NOVEL CORONAVIRUS, NAA: SARS-CoV-2, NAA: NOT DETECTED

## 2020-01-30 LAB — SARS-COV-2, NAA 2 DAY TAT

## 2020-02-08 ENCOUNTER — Other Ambulatory Visit: Payer: Self-pay

## 2020-02-08 ENCOUNTER — Ambulatory Visit
Admission: EM | Admit: 2020-02-08 | Discharge: 2020-02-08 | Disposition: A | Discharge status: Home / Self Care | Payer: BC Managed Care – PPO | Attending: Emergency Medicine | Admitting: Emergency Medicine

## 2020-02-08 ENCOUNTER — Encounter: Payer: Self-pay | Admitting: Emergency Medicine

## 2020-02-08 ENCOUNTER — Ambulatory Visit (INDEPENDENT_AMBULATORY_CARE_PROVIDER_SITE_OTHER): Payer: BC Managed Care – PPO

## 2020-02-08 DIAGNOSIS — J189 Pneumonia, unspecified organism: Secondary | ICD-10-CM | POA: Diagnosis not present

## 2020-02-08 DIAGNOSIS — R059 Cough, unspecified: Secondary | ICD-10-CM | POA: Diagnosis not present

## 2020-02-08 MED ORDER — AMOXICILLIN 500 MG PO CAPS: 1000.0000 mg | ORAL_CAPSULE | Freq: Three times a day (TID) | ORAL | 0 refills | Status: AC

## 2020-02-08 MED ORDER — BENZONATATE 200 MG PO CAPS: 200.0000 mg | ORAL_CAPSULE | Freq: Three times a day (TID) | ORAL | 0 refills | Status: DC

## 2020-02-08 MED ORDER — AZITHROMYCIN 250 MG PO TABS: 250.0000 mg | ORAL_TABLET | Freq: Every day | ORAL | 0 refills | Status: DC

## 2020-02-08 NOTE — ED Provider Notes (Signed)
[facility]    CSN: [id number] Arrival date & time: 02/08/20  1309      History   Chief Complaint Chief Complaint  Patient presents with  . Cough    HPI Caroline Woods is a 29 y.o. female  Presenting for persistent, productive cough.  Has been ongoing for the last 3 weeks.  Patient previously evaluated for this on 01/28/2020: Please see those records, reviewed by me at time of visit.  treated empirically for bronchitis with supportive care including Tessalon, albuterol, prednisone.  Covid negative.  Past Medical History:  Diagnosis Date  . ADHD (attention deficit hyperactivity disorder)   . Childhood asthma   . 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 Vaginal Pap smear, abnormal 2014   Unknown result    Patient Active Problem List   Diagnosis Date Noted  . History of C-section 06/21/2017  . Obesity, Class II, BMI 35-39.9 04/19/2017  . 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 CESAREAN SECTION N/A 06/18/2017   Procedure: CESAREAN SECTION;  Surgeon: 06/20/2017, MD;  Location: [facility];  Service: Obstetrics;  Laterality: N/A;  . NO PAST SURGERIES      OB History    Gravida  1   Para  1   Term  0   Preterm  1   AB  0   Living  1     SAB  0   TAB  0   Ectopic  0   Multiple  0   Live Births  1            Home Medications    Prior to Admission medications   Medication Sig Start Date End Date Taking? Authorizing Provider  albuterol (VENTOLIN HFA) 108 (90 Base) MCG/ACT inhaler Inhale 1-2 puffs into the lungs every 6 (six) hours as needed for wheezing or shortness of breath. 01/29/20   13/3/21, Amy V, PA-C  amoxicillin (AMOXIL) 500 MG capsule Take 2 capsules (1,000 mg total) by mouth 3 (three) times daily for 5 days. 02/08/20  02/13/20  Hall-Potvin, 02/15/20, PA-C  azithromycin (ZITHROMAX) 250 MG tablet Take 1 tablet (250 mg total) by mouth daily. Take first 2 tablets together, then 1 every day until finished. 02/08/20   Hall-Potvin, 02/10/20, PA-C  benzonatate (TESSALON) 200 MG capsule Take 1 capsule (200 mg total) by mouth every 8 (eight) hours. 02/08/20   Hall-Potvin, 02/10/20, PA-C  ferrous sulfate 325 (65 FE) MG tablet Take 1 tablet (325 mg total) by mouth 2 (two) times daily with a meal. Patient not taking: Reported on 12/23/2019 06/21/17 01/29/20  13/3/21, MD  lisdexamfetamine (VYVANSE) 50 MG capsule Take 50 mg by mouth 3 (three) times a week. Patient takes MWF. Patient not taking: Reported on 12/23/2019  01/29/20  [provider]    Family History Family History  Problem Relation Age of Onset  . Hypertension Mother   . Kidney cancer Maternal Grandfather     Social History Social History   Tobacco Use  . Smoking status: Never Smoker  . Smokeless tobacco: Never Used  Substance Use Topics  . Alcohol use: No  . Drug use: No     Allergies   Patient has no known allergies.   Review of Systems Review of Systems  Constitutional:  Negative for activity change, appetite change, fatigue and fever.  HENT: Negative for ear pain, sinus pain, sore throat and voice change.   Eyes: Negative for pain, redness and visual disturbance.  Respiratory: Positive for cough. Negative for shortness of breath, wheezing and stridor.   Cardiovascular: Negative for chest pain and palpitations.  Gastrointestinal: Negative for abdominal pain, diarrhea and vomiting.  Musculoskeletal: Negative for arthralgias and myalgias.  Skin: Negative for rash and wound.  Neurological: Negative for syncope and headaches.     Physical Exam Triage Vital Signs ED Triage Vitals [02/08/20 1327]  Enc Vitals Group     BP 121/77     Pulse Rate (!) 110     Resp 18     Temp 98.4 F (36.9 C)     Temp Source Oral      SpO2 98 %     Weight      Height      Head Circumference      Peak Flow      Pain Score      Pain Loc      Pain Edu?      Excl. in GC?    No data found.  Updated Vital Signs BP 121/77 (BP Location: Left Arm)   Pulse (!) 110   Temp 98.4 F (36.9 C) (Oral)   Resp 18   SpO2 98%   Visual Acuity Right Eye Distance:   Left Eye Distance:   Bilateral Distance:    Right Eye Near:   Left Eye Near:    Bilateral Near:     Physical Exam Constitutional:      General: She is not in acute distress.    Appearance: She is not ill-appearing or diaphoretic.  HENT:     Head: Normocephalic and atraumatic.     Mouth/Throat:     Mouth: Mucous membranes are moist.     Pharynx: Oropharynx is clear. No oropharyngeal exudate or posterior oropharyngeal erythema.  Eyes:     General: No scleral icterus.    Conjunctiva/sclera: Conjunctivae normal.     Pupils: Pupils are equal, round, and reactive to light.  Neck:     Comments: Trachea midline, negative JVD Cardiovascular:     Rate and Rhythm: Normal rate and regular rhythm.     Heart sounds: No murmur heard.  No gallop.   Pulmonary:     Effort: Pulmonary effort is normal. No respiratory distress.     Breath sounds: No wheezing, rhonchi or rales.  Musculoskeletal:     Cervical back: Neck supple. No tenderness.  Lymphadenopathy:     Cervical: No cervical adenopathy.  Skin:    Capillary Refill: Capillary refill takes less than 2 seconds.     Coloration: Skin is not jaundiced or pale.     Findings: No rash.  Neurological:     General: No focal deficit present.     Mental Status: She is alert and oriented to person, place, and time.      UC Treatments / Results  Labs (all labs ordered are listed, but only abnormal results are displayed) Labs Reviewed - No data to display  EKG   Radiology DG Chest 2 View  Result Date: 02/08/2020 CLINICAL DATA:  Cough EXAM: CHEST - 2 VIEW COMPARISON:  May 15, 2010 FINDINGS: Focal area of  airspace opacity noted in the left base. Lungs elsewhere are clear. Heart size and pulmonary vascular normal. No adenopathy. No bone lesions. IMPRESSION: Focal airspace opacity left base consistent with  focus of pneumonia. Lungs elsewhere clear. Heart size normal. No adenopathy. These results will be called to the ordering clinician or representative by the Radiologist Assistant, and communication documented in the PACS or [employer]. Electronically Signed   By: Bretta Bang III M.D.   On: 02/08/2020 14:08    Procedures Procedures (including critical care time)  Medications Ordered in UC Medications - No data to display  Initial Impression / Assessment and Plan / UC Course  I have reviewed the triage vital signs and the nursing notes.  Pertinent labs & imaging results that were available during my care of the patient were reviewed by me and considered in my medical decision making (see chart for details).     Patient appears well in office, though has had persistent cough for over 3 weeks.  Chest x-ray showing focal airspace opacity in left base consistent with pneumonia.  Will start treatment for CAP as outlined below.  Return precautions discussed, pt verbalized understanding and is agreeable to plan. Final Clinical Impressions(s) / UC Diagnoses   Final diagnoses:  Pneumonia of left lower lobe due to infectious organism     Discharge Instructions     60 pack as directed. Amoxicillin: 2 tabs, 3 times a day for 5 days. Important to eat food with antibiotics. Tessalon as needed. Follow-up with PCP in 1-2 weeks.    ED Prescriptions    Medication Sig Dispense Auth. Provider   amoxicillin (AMOXIL) 500 MG capsule Take 2 capsules (1,000 mg total) by mouth 3 (three) times daily for 5 days. 30 capsule Hall-Potvin, [place], PA-C   azithromycin (ZITHROMAX) 250 MG tablet Take 1 tablet (250 mg total) by mouth daily. Take first 2 tablets together, then 1 every day until finished.  6 tablet Hall-Potvin, [place], PA-C   benzonatate (TESSALON) 200 MG capsule Take 1 capsule (200 mg total) by mouth every 8 (eight) hours. 21 capsule Hall-Potvin, [place], PA-C     PDMP not reviewed this encounter.   Hall-Potvin, [place], [place] 02/08/20 1425

## 2020-02-08 NOTE — Discharge Instructions (Signed)
60 pack as directed. Amoxicillin: 2 tabs, 3 times a day for 5 days. Important to eat food with antibiotics. Tessalon as needed. Follow-up with PCP in 1-2 weeks.

## 2020-02-08 NOTE — ED Triage Notes (Signed)
Pt sts cough x 3 weeks no improved with meds given at last visit; pt sts pain in ribs with cough

## 2020-02-14 ENCOUNTER — Telehealth: Payer: Self-pay | Admitting: Physician Assistant

## 2020-02-14 ENCOUNTER — Ambulatory Visit
Admission: EM | Admit: 2020-02-14 | Discharge: 2020-02-14 | Disposition: A | Discharge status: Home / Self Care | Payer: BC Managed Care – PPO | Attending: Physician Assistant | Admitting: Physician Assistant

## 2020-02-14 DIAGNOSIS — R0781 Pleurodynia: Secondary | ICD-10-CM

## 2020-02-14 MED ORDER — KETOROLAC TROMETHAMINE 15 MG/ML IJ SOLN
15.0000 mg | Freq: Once | INTRAMUSCULAR | Status: AC
  Administered 2020-02-14: 15 mg via INTRAMUSCULAR

## 2020-02-14 MED ORDER — TIZANIDINE HCL 2 MG PO TABS: 2.0000 mg | ORAL_TABLET | Freq: Three times a day (TID) | ORAL | 0 refills | Status: DC | PRN

## 2020-02-14 MED ORDER — FLUCONAZOLE 150 MG PO TABS: 150.0000 mg | ORAL_TABLET | Freq: Every day | ORAL | 0 refills | Status: DC

## 2020-02-14 MED ORDER — PROMETHAZINE-DM 6.25-15 MG/5ML PO SYRP: 5.0000 mL | ORAL_SOLUTION | Freq: Four times a day (QID) | ORAL | 0 refills | Status: DC | PRN

## 2020-02-14 MED ORDER — MELOXICAM 7.5 MG PO TABS: 7.5000 mg | ORAL_TABLET | Freq: Every day | ORAL | 0 refills | Status: DC

## 2020-02-14 NOTE — ED Triage Notes (Signed)
Pt c/o rib pain x3wks after cough hard from having bronchitis and PNA. Pt c/o bilateral ear pain/pressure x4 days

## 2020-02-14 NOTE — ED Provider Notes (Signed)
[facility]    CSN: [id number] Arrival date & time: 02/14/20  1839      History   Chief Complaint Chief Complaint  Patient presents with  . rib pain  . Otalgia    HPI Caroline Woods is a 29 y.o. female.   29 year old female comes in for right rib pain. She was treated for CAP to left lower lobe 02/08/2020 with amoxil and azithromycin, which has improved symptoms. She has since then finished course of abx. States has residual shob and cough, but overall felt better. Did have some rib pain during this time, but today, during a coughing fit, had acute increase in right rib pain. Denies worsening cough, shortness of breath.     Past Medical History:  Diagnosis Date  . ADHD (attention deficit hyperactivity disorder)   . Childhood asthma   . 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 Vaginal Pap smear, abnormal 2014   Unknown result    Patient Active Problem List   Diagnosis Date Noted  . History of C-section 06/21/2017  . Obesity, Class II, BMI 35-39.9 04/19/2017  . 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 CESAREAN SECTION N/A 06/18/2017   Procedure: CESAREAN SECTION;  Surgeon: 06/20/2017, MD;  Location: [facility];  Service: Obstetrics;  Laterality: N/A;  . NO PAST SURGERIES      OB History    Gravida  1   Para  1   Term  0   Preterm  1   AB  0   Living  1     SAB  0   TAB  0   Ectopic  0   Multiple  0   Live Births  1            Home Medications    Prior to Admission medications   Medication Sig Start Date End Date Taking? Authorizing Provider  albuterol (VENTOLIN HFA) 108 (90 Base) MCG/ACT inhaler Inhale 1-2 puffs into the lungs every 6 (six) hours as needed for wheezing or shortness of breath. 01/29/20   13/3/21, Glennys Schorsch  V, PA-C  benzonatate (TESSALON) 200 MG capsule Take 1 capsule (200 mg total) by mouth every 8 (eight) hours. 02/08/20   Hall-Potvin, 02/10/20, PA-C  fluconazole (DIFLUCAN) 150 MG tablet Take 1 tablet (150 mg total) by mouth daily. Take second dose 72 hours later if symptoms still persists. 02/14/20   02/16/20, Cadance Raus V, PA-C  meloxicam (MOBIC) 7.5 MG tablet Take 1 tablet (7.5 mg total) by mouth daily. 02/14/20   02/16/20, Kwesi Sangha V, PA-C  promethazine-dextromethorphan (PROMETHAZINE-DM) 6.25-15 MG/5ML syrup Take 5 mLs by mouth 4 (four) times daily as needed for cough. 02/14/20   02/16/20, Delos Klich V, PA-C  tiZANidine (ZANAFLEX) 2 MG tablet Take 1 tablet (2 mg total) by mouth every 8 (eight) hours as needed for muscle spasms. 02/14/20   02/16/20, Kaleah Hagemeister V, PA-C  ferrous sulfate 325 (65 FE) MG tablet Take 1 tablet (325 mg total) by mouth 2 (two) times daily with a meal. Patient not taking: Reported on 12/23/2019 06/21/17 01/29/20  13/3/21, MD  lisdexamfetamine (VYVANSE) 50 MG capsule Take 50 mg by mouth 3 (three) times a week. Patient takes MWF. Patient not taking: Reported on 12/23/2019  01/29/20  [provider]  Family History Family History  Problem Relation Age of Onset  . Hypertension Mother   . Kidney cancer Maternal Grandfather     Social History Social History   Tobacco Use  . Smoking status: Never Smoker  . Smokeless tobacco: Never Used  Substance Use Topics  . Alcohol use: No  . Drug use: No     Allergies   Patient has no known allergies.   Review of Systems Review of Systems  Reason unable to perform ROS: See HPI as above.     Physical Exam Triage Vital Signs ED Triage Vitals  Enc Vitals Group     BP 02/14/20 1843 127/83     Pulse Rate 02/14/20 1843 (!) 111     Resp 02/14/20 1843 20     Temp 02/14/20 1843 98.3 F (36.8 C)     Temp Source 02/14/20 1843 Oral     SpO2 02/14/20 1843 98 %     Weight --      Height --      Head Circumference --      Peak Flow --      Pain  Score 02/14/20 1851 5     Pain Loc --      Pain Edu? --      Excl. in GC? --    No data found.  Updated Vital Signs BP 127/83 (BP Location: Left Arm)   Pulse (!) 111   Temp 98.3 F (36.8 C) (Oral)   Resp 20   SpO2 98%   Visual Acuity Right Eye Distance:   Left Eye Distance:   Bilateral Distance:    Right Eye Near:   Left Eye Near:    Bilateral Near:     Physical Exam Constitutional:      General: She is not in acute distress.    Appearance: Normal appearance. She is well-developed. She is not ill-appearing, toxic-appearing or diaphoretic.  HENT:     Head: Normocephalic and atraumatic.     Right Ear: Tympanic membrane, ear canal and external ear normal. Tympanic membrane is not erythematous or bulging.     Left Ear: Tympanic membrane, ear canal and external ear normal. Tympanic membrane is not erythematous or bulging.     Nose:     Right Sinus: No maxillary sinus tenderness or frontal sinus tenderness.     Left Sinus: No maxillary sinus tenderness or frontal sinus tenderness.     Mouth/Throat:     Mouth: Mucous membranes are moist.     Pharynx: Oropharynx is clear. Uvula midline.  Eyes:     Conjunctiva/sclera: Conjunctivae normal.     Pupils: Pupils are equal, round, and reactive to light.  Cardiovascular:     Rate and Rhythm: Normal rate and regular rhythm.  Pulmonary:     Effort: Pulmonary effort is normal. No accessory muscle usage, prolonged expiration, respiratory distress or retractions.     Breath sounds: No decreased air movement or transmitted upper airway sounds. No decreased breath sounds.     Comments: LCTAB Chest:     Comments: Right lower lateral ribs tender to palpation.  Musculoskeletal:     Cervical back: Normal range of motion and neck supple.  Skin:    General: Skin is warm and dry.  Neurological:     Mental Status: She is alert and oriented to person, place, and time.      UC Treatments / Results  Labs (all labs ordered are listed, but  only abnormal results are displayed) Labs  Reviewed - No data to display  EKG   Radiology No results found.  Procedures Procedures (including critical care time)  Medications Ordered in UC Medications  ketorolac (TORADOL) 15 MG/ML injection 15 mg (has no administration in time range)    Initial Impression / Assessment and Plan / UC Course  I have reviewed the triage vital signs and the nursing notes.  Pertinent labs & imaging results that were available during my care of the patient were reviewed by me and considered in my medical decision making (see chart for details).    Discussed repeat CXR vs symptomatic treatment for muscles. After discussion, we decided to defer CXR for now given overall improvement of symptoms until latest coughing episode. Will treat symptomatically with NSAIDs, muscle relaxant. Cough medicine as needed. Discussed may repeat CXR if symptoms not improving. Return precautions given.  Final Clinical Impressions(s) / UC Diagnoses   Final diagnoses:  Rib pain on right side    ED Prescriptions    Medication Sig Dispense Auth. Provider   meloxicam (MOBIC) 7.5 MG tablet Take 1 tablet (7.5 mg total) by mouth daily. 15 tablet Calbert Hulsebus V, PA-C   tiZANidine (ZANAFLEX) 2 MG tablet Take 1 tablet (2 mg total) by mouth every 8 (eight) hours as needed for muscle spasms. 15 tablet Vickye Astorino V, PA-C   promethazine-dextromethorphan (PROMETHAZINE-DM) 6.25-15 MG/5ML syrup Take 5 mLs by mouth 4 (four) times daily as needed for cough. 118 mL Belinda Fisher, PA-C     PDMP not reviewed this encounter.   Belinda Fisher, PA-C 02/14/20 1909

## 2020-02-14 NOTE — Discharge Instructions (Signed)
Toradol injection in office today. Start Mobic. Do not take ibuprofen (motrin/advil)/ naproxen (aleve) while on mobic. Tizanidine as needed, this can make you drowsy, so do not take if you are going to drive, operate heavy machinery, or make important decisions. You can use cough syrup as needed for cough. Follow up with PCP for reevaluation. If worsening cough, fever, worsening shortness of breath, go to the emergency department for further evaluation.

## 2020-02-14 NOTE — Telephone Encounter (Signed)
Patient started on azithromycin, and now with vaginal itching. Diflucan called into pharmacy.

## 2020-07-16 ENCOUNTER — Other Ambulatory Visit: Payer: Self-pay

## 2020-07-16 ENCOUNTER — Ambulatory Visit (INDEPENDENT_AMBULATORY_CARE_PROVIDER_SITE_OTHER): Payer: BC Managed Care – PPO | Admitting: Obstetrics and Gynecology

## 2020-07-16 ENCOUNTER — Encounter: Payer: Self-pay | Admitting: Obstetrics and Gynecology

## 2020-07-16 VITALS — BP 129/84 | HR 98 | Wt 207.0 lb

## 2020-07-16 DIAGNOSIS — Z3046 Encounter for surveillance of implantable subdermal contraceptive: Secondary | ICD-10-CM | POA: Diagnosis not present

## 2020-07-16 DIAGNOSIS — Z30017 Encounter for initial prescription of implantable subdermal contraceptive: Secondary | ICD-10-CM

## 2020-07-16 HISTORY — PX: REMOVAL OF IMPLANON ROD: OBO 1006

## 2020-07-16 HISTORY — PX: INSERTION OF IMPLANON ROD: OBO 1005

## 2020-07-16 MED ORDER — ETONOGESTREL 68 MG ~~LOC~~ IMPL
68.0000 mg | DRUG_IMPLANT | Freq: Once | SUBCUTANEOUS | Status: AC
Start: 2020-07-16 — End: 2020-07-16
  Administered 2020-07-16: 68 mg via SUBCUTANEOUS

## 2020-07-16 NOTE — Procedures (Signed)
Nexplanon Removal and Insertion Procedure Note Nexplanon expires in May 2022 and she desires another one; she is amenorrheic on it.  Prior to the procedure being performed, the patient was asked to state their full name, date of birth, type of procedure being performed and the exact location of the operative site. This information was then checked against the documentation in the patient's chart. Prior to the procedure being performed, a "time out" was performed by the physician that confirmed the correct patient, procedure and site.  After informed consent was obtained, the patient's left arm was examined and the Nexplanon rod was noted to be easily palpable. The area was cleaned with alcohol then local anesthesia was infiltrated with 3 ml of 1% lidocaine with epinephrine. The area was prepped with betadine. Using sterile technique, the Nexplanon device was brought to the incision site. The capsule was scrapped off with the scalpel, the Nexplanon grasped with hemostats, and easily removed; the removal site was hemostatic. The Nexplanon was inspected and noted to be intact.  A steri-strip and a pressure dressing was applied.  This site was a little high and a large vein runs near it, so I decided to do the new insertion site 4cm below it in the fat pad  The area was cleaned with alcohol then local anesthesia was infiltrated with 3 ml of 1% lidocaine with epinephrine along the planned insertion track. The area was prepped with betadine. Using sterile technique the Nexplanon device was inserted per manufacturer's guidelines in the subdermal connective tissue using the standard insertion technique without difficulty. Pressure was applied and the insertion site was hemostatic. The presence of the Nexplanon was confirmed immediately after insertion by palpation by both me and the patient and by checking the tip of needle for the absence of the insert.  A pressure dressing was applied.  The patient tolerated the  procedure well.  Cornelia Copa MD Attending Center for [facility])

## 2020-09-16 ENCOUNTER — Other Ambulatory Visit: Payer: Self-pay

## 2020-09-16 ENCOUNTER — Encounter (HOSPITAL_COMMUNITY): Payer: Self-pay | Admitting: *Deleted

## 2020-09-16 ENCOUNTER — Emergency Department (HOSPITAL_COMMUNITY)
Admission: EM | Admit: 2020-09-16 | Discharge: 2020-09-16 | Disposition: A | Discharge status: Home / Self Care | Payer: BC Managed Care – PPO | Attending: Emergency Medicine | Admitting: Emergency Medicine

## 2020-09-16 DIAGNOSIS — H1031 Unspecified acute conjunctivitis, right eye: Secondary | ICD-10-CM | POA: Diagnosis not present

## 2020-09-16 DIAGNOSIS — H109 Unspecified conjunctivitis: Secondary | ICD-10-CM

## 2020-09-16 DIAGNOSIS — H5711 Ocular pain, right eye: Secondary | ICD-10-CM | POA: Diagnosis not present

## 2020-09-16 MED ORDER — TETRACAINE HCL 0.5 % OP SOLN
1.0000 [drp] | Freq: Once | OPHTHALMIC | Status: AC
  Administered 2020-09-16: 20:00:00 1 [drp] via OPHTHALMIC
  Filled 2020-09-16: qty 4

## 2020-09-16 MED ORDER — FLUORESCEIN SODIUM 1 MG OP STRP
1.0000 | ORAL_STRIP | Freq: Once | OPHTHALMIC | Status: AC
  Administered 2020-09-16: 20:00:00 1 via OPHTHALMIC
  Filled 2020-09-16: qty 1

## 2020-09-16 MED ORDER — ERYTHROMYCIN 5 MG/GM OP OINT
TOPICAL_OINTMENT | Freq: Once | OPHTHALMIC | Status: AC
  Filled 2020-09-16: qty 3.5

## 2020-09-16 MED ORDER — ERYTHROMYCIN 5 MG/GM OP OINT: TOPICAL_OINTMENT | OPHTHALMIC | 0 refills | Status: DC

## 2020-09-16 NOTE — ED Triage Notes (Signed)
Pt complains of right eye itching and discharge x 1 week.

## 2020-09-16 NOTE — ED Provider Notes (Signed)
[facility]-EMERGENCY DEPT Provider Note   CSN: [id number] Arrival date & time: 09/16/20  1800     History Chief Complaint  Patient presents with  . Eye Problem    Caroline Woods is a 30 y.o. female who presents for evaluation of redness, irritation, pain of the right eye.  She states that the start about 2 days ago.  She reports her son attends daycare and there was pinkeye going around.  She reports that she was around her son before symptoms started.  She states that the right eye has been red and irritated as well as itchy.  At night, it drains.  When she wakes up in the morning, it is crusted.  She states the left eye started being irritated.  She denies any blurry vision.  No trauma, injury to the eye.  She does not wear contacts.  She does wear glasses.  No fevers.  The history is provided by the patient.      Past Medical History:  Diagnosis Date  . ADHD (attention deficit hyperactivity disorder)   . Childhood asthma   . 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 Vaginal Pap smear, abnormal 2014   Unknown result    Patient Active Problem List   Diagnosis Date Noted  . History of C-section 06/21/2017  . Obesity, Class II, BMI 35-39.9 04/19/2017  . 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 CESAREAN SECTION N/A 06/18/2017   Procedure: CESAREAN SECTION;  Surgeon: 06/20/2017, MD;  Location: [facility];  Service: Obstetrics;  Laterality: N/A;  . INSERTION OF IMPLANON ROD  07/16/2020      . NO PAST SURGERIES    . REMOVAL OF IMPLANON ROD  07/16/2020         OB History     Gravida  1   Para  1   Term  0   Preterm  1   AB  0   Living  1      SAB  0   IAB  0   Ectopic  0   Multiple  0   Live Births  1            Family History  Problem Relation Age of Onset  . Hypertension Mother   . Kidney cancer Maternal Grandfather     Social History   Tobacco Use  . Smoking status: Never  . Smokeless tobacco: Never  Substance Use Topics  . Alcohol use: No  . Drug use: No    Home Medications Prior to Admission medications   Medication Sig Start Date End Date Taking? Authorizing Provider  erythromycin ophthalmic ointment Place a 1/2 inch ribbon of ointment into the lower eyelid. 09/16/20  Yes 09/18/20, PA-C  albuterol (VENTOLIN HFA) 108 (90 Base) MCG/ACT inhaler Inhale 1-2 puffs into the lungs every 6 (six) hours as needed for wheezing or shortness of breath. Patient not taking: Reported on 07/16/2020 01/29/20   13/3/21, PA-C  benzonatate (TESSALON) 200 MG capsule Take 1 capsule (200 mg total) by mouth every 8 (eight) hours. Patient not taking: Reported on 07/16/2020 02/08/20   Hall-Potvin, 02/10/20, PA-C  fluconazole (DIFLUCAN) 150 MG tablet Take 1 tablet (150 mg total) by mouth daily. Take second dose 72 hours later  if symptoms still persists. Patient not taking: Reported on 07/16/2020 02/14/20   Belinda Fisher, PA-C  meloxicam (MOBIC) 7.5 MG tablet Take 1 tablet (7.5 mg total) by mouth daily. Patient not taking: Reported on 07/16/2020 02/14/20   Belinda Fisher, PA-C  promethazine-dextromethorphan (PROMETHAZINE-DM) 6.25-15 MG/5ML syrup Take 5 mLs by mouth 4 (four) times daily as needed for cough. Patient not taking: Reported on 07/16/2020 02/14/20   Belinda Fisher, PA-C  tiZANidine (ZANAFLEX) 2 MG tablet Take 1 tablet (2 mg total) by mouth every 8 (eight) hours as needed for muscle spasms. Patient not taking: Reported on 07/16/2020 02/14/20   Belinda Fisher, PA-C  ferrous sulfate 325 (65 FE) MG tablet Take 1 tablet (325 mg total) by mouth 2 (two) times daily with a meal. Patient not taking: Reported on 12/23/2019 06/21/17 01/29/20  Federico Flake, MD  lisdexamfetamine (VYVANSE) 50 MG capsule Take 50 mg by  mouth 3 (three) times a week. Patient takes MWF. Patient not taking: Reported on 12/23/2019  01/29/20  [provider]    Allergies    Patient has no known allergies.  Review of Systems   Review of Systems  Constitutional:  Negative for fever.  Eyes:  Positive for pain, discharge, redness and itching. Negative for photophobia and visual disturbance.  All other systems reviewed and are negative.  Physical Exam Updated Vital Signs BP (!) 158/95 (BP Location: Left Arm)   Pulse (!) 101   Temp 98 F (36.7 C) (Oral)   Resp 18   SpO2 96%   Physical Exam Vitals and nursing note reviewed.  Constitutional:      Appearance: She is well-developed.  HENT:     Head: Normocephalic and atraumatic.  Eyes:     General: No scleral icterus.       Right eye: No discharge.        Left eye: No discharge.     Conjunctiva/sclera:     Right eye: Right conjunctiva is injected.     Comments: Right conjunctival injection.  Pupils equal and reactive bilaterally.  EOMs intact any difficulty.  No nystagmus.  No swelling, erythema noted to superior and inferior periorbital region.  Pulmonary:     Effort: Pulmonary effort is normal.  Skin:    General: Skin is warm and dry.  Neurological:     Mental Status: She is alert.  Psychiatric:        Speech: Speech normal.        Behavior: Behavior normal.       ED Results / Procedures / Treatments   Labs (all labs ordered are listed, but only abnormal results are displayed) Labs Reviewed - No data to display  EKG None  Radiology No results found.  Procedures Procedures   Medications Ordered in ED Medications  tetracaine (PONTOCAINE) 0.5 % ophthalmic solution 1 drop (1 drop Both Eyes Given 09/16/20 1932)  fluorescein ophthalmic strip 1 strip (1 strip Both Eyes Given 09/16/20 1932)  erythromycin ophthalmic ointment ( Right Eye Given 09/16/20 2011)    ED Course  I have reviewed the triage vital signs and the nursing notes.  Pertinent  labs & imaging results that were available during my care of the patient were reviewed by me and considered in my medical decision making (see chart for details).    MDM Rules/Calculators/A&P                          30 year old female  who presents for evaluation of right eye irritation, redness.  She reports that the left eye has been irritated as well.  No fevers, vision changes.  On initial arrival, she is afebrile nontoxic-appearing.  Vital signs are stable.  On exam, she has right conjunctival injection.  EOMs intact without difficulty.  History/physical exam not concerning for preseptal, orbital cellulitis.  Suspect this is likely conjunctivitis.  Doubt corneal abrasion but given eye redness, will obtain Woods lamp evaluation.  Evaluation of Woods lamp shows no evidence of fluorescein uptake, dendritic lesions.  Negative Seidel sign.  Will place patient on antibiotic ointment given concern for conjunctivitis.  Patient instructed on at home supportive care measures. At this time, patient exhibits no emergent life-threatening condition that require further evaluation in ED. Patient had ample opportunity for questions and discussion. All patient's questions were answered with full understanding. Strict return precautions discussed. Patient expresses understanding and agreement to plan.       Visual Acuity  Right Eye Distance: 20/50 Left Eye Distance: 20/50 Bilateral Distance: 20/50  Right Eye Near:   Left Eye Near:    Bilateral Near:      Final Clinical Impression(s) / ED Diagnoses Final diagnoses:  Conjunctivitis of right eye, unspecified conjunctivitis type    Rx / DC Orders ED Discharge Orders          Ordered    erythromycin ophthalmic ointment        09/16/20 2006             Rosana Hoes 09/16/20 2015    Pollyann Savoy, MD 09/16/20 2226

## 2020-09-16 NOTE — Discharge Instructions (Addendum)
Use antibiotic ointment as directed.  As we discussed, make sure that you are washing your hands frequently to prevent spreading.  Return to emergency department for any worsening pain, redness, fevers or any other worsening concerning symptoms.

## 2020-10-09 ENCOUNTER — Encounter: Payer: Self-pay | Admitting: Radiology

## 2020-12-02 ENCOUNTER — Ambulatory Visit: Payer: BC Managed Care – PPO | Admitting: Family Medicine

## 2020-12-28 ENCOUNTER — Ambulatory Visit (INDEPENDENT_AMBULATORY_CARE_PROVIDER_SITE_OTHER): Payer: Medicaid Other | Admitting: Family Medicine

## 2020-12-28 ENCOUNTER — Other Ambulatory Visit: Payer: Self-pay

## 2020-12-28 ENCOUNTER — Encounter: Payer: Self-pay | Admitting: Family Medicine

## 2020-12-28 DIAGNOSIS — Z975 Presence of (intrauterine) contraceptive device: Secondary | ICD-10-CM

## 2020-12-28 DIAGNOSIS — Z23 Encounter for immunization: Secondary | ICD-10-CM

## 2020-12-28 DIAGNOSIS — Z01419 Encounter for gynecological examination (general) (routine) without abnormal findings: Secondary | ICD-10-CM

## 2020-12-28 NOTE — Progress Notes (Signed)
   GYNECOLOGY ANNUAL PREVENTATIVE CARE ENCOUNTER NOTE  Subjective:   Caroline Woods is a 30 y.o. G60P0101 female here for a routine annual gynecologic exam.  Current complaints: none.SOme questions about routine labs she got at an outside office and being worried about a "low immune system."    Denies abnormal vaginal bleeding, discharge, pelvic pain, problems with intercourse or other gynecologic concerns.    Gynecologic History No LMP recorded. Patient has had an implant. Contraception: Nexplanon Last Pap: 2021. Last mammogram: _0 .   The following portions of the patient's history were reviewed and updated as appropriate: allergies, current medications, past family history, past medical history, past social history, past surgical history and problem list.  Review of Systems Pertinent items are noted in HPI.   Objective:  BP (!) 137/91   Pulse (!) 112   Wt 207 lb (93.9 kg)   BMI 39.11 kg/m  CONSTITUTIONAL: Well-developed, well-nourished female in no acute distress.  HENT:  Normocephalic, atraumatic, External right and left ear normal. Oropharynx is clear and moist EYES:  No scleral icterus.  NECK: Normal range of motion, supple, no masses.  Normal thyroid.  SKIN: Skin is warm and dry. No rash noted. Not diaphoretic. No erythema. No pallor. NEUROLOGIC: Alert and oriented to person, place, and time. Normal reflexes, muscle tone coordination. No cranial nerve deficit noted. PSYCHIATRIC: Normal mood and affect. Normal behavior. Normal judgment and thought content. CARDIOVASCULAR: Normal heart rate noted, regular rhythm. 2+ distal pulses. RESPIRATORY: Effort and breath sounds normal, no problems with respiration noted. BREASTS: Symmetric in size. No masses, skin changes, nipple drainage, or lymphadenopathy. ABDOMEN: Soft,  no distention noted.  No tenderness, rebound or guarding.  PELVIC: not indicated MUSCULOSKELETAL: Normal range of motion.     Assessment and Plan:  1) Annual  gynecologic examination - pap not indicated:  Will follow up results of pap smear and manage accordingly. Declined STI screen today.  Routine preventative health maintenance measures emphasized.  2) Contraception counseling: Reviewed all forms of birth control options available including abstinence; over the counter/barrier methods; hormonal contraceptive medication including pill, patch, ring, injection,contraceptive implant; hormonal and nonhormonal IUDs; permanent sterilization options including vasectomy and the various tubal sterilization modalities. Risks and benefits reviewed.  Questions were answered.  Written information was also given to the patient to review.  Patient desires to keep nexplanon, this was prescribed for patient. She will follow up in  1 for surveillance.  She was told to call with any further questions, or with any concerns about this method of contraception.  Emphasized use of condoms 100% of the time for STI prevention.  Labs-- reviewed on patient's phone She is non-immune to Mumps but measles and rubella titers appropriate CBC WNL  I have no concerns about this patient's immune system from what I reviewed and encouraged her to discuss with PCP. Reviewed possible MMR for improved mumps immunity   Please refer to After Visit Summary for other counseling recommendations.   Return in about 1 year (around 12/28/2021) for Yearly wellness exam.  Caren Macadam, MD, MPH, ABFM Attending Physician Center for [facility]

## 2020-12-28 NOTE — Progress Notes (Signed)
Discuss BV

## 2020-12-28 NOTE — Patient Instructions (Signed)
Natural Remedies for Bacterial Vaginosis ° °Option #1 °1 Tbsp Fractitionated Coconut Oil °10 drops of Melaleuca (Tea Tree) Oil ° °Mix ingredients together well.  Soak 3-4 tampons (in applicators) in that mixture until all or mostly all mixture is soaked up into the tampons.  Insert 1 saturated tampon vaginally and wear overnight for 3-4 nights.   ° °Option #2 (sometimes to be used in conjunction with option #1) °Fill tub with enough to cover lap/lower abdomen warm water.  Mix 1/2 cup of baking soda in water.  Soak in water/baking soda mixture for at least 20 minutes.  Be sure to swish water in between legs to get as much in vagina as possible.  This soak should be done after sexual intercourse and menstrual cycles.   ° ° °Option #3 (sometimes to be used in conjunction with option #1 and 2) °Fill tub with enough to cover lap/lower abdomen warm water.  Mix 2-4 cup of apple cider vinegar in water.  Soak in water/vinegar mixture for at least 20 minutes.  Be sure to swish water in between legs to get as much in vagina as possible.  This soak should be done after sexual intercourse and menstrual cycles.   ° °GO WHITE: °Soap: UNSCENTED Dove (white box light green writing) °Laundry detergent (underwear)- Dreft or [employer] unscented °WHITE 100% cotton panties (NOT just cotton crouch) °Sanitary napkin/panty liners: UNSCENTED.  If it doesn't SAY unscented it can have a scent/perfume    °NO PERFUMES OR LOTIONS OR POTIONS in the vulvar area (may use regular [place]) °Condoms: hypoallergenic only. Non dyed (no color) °Toilet papers: white only °Wash clothes: use a separate wash cloth. WHITE.  Wash in Dreft.  ° °You can purchase Tea Tree Oil locally at: ° °[employer] °[address] °[place] [address] ° °[employer] [employer] °[address] °[place] [address] ° °Advise that these alternatives will not replace the need to be evaluated if symptoms persist. You will need to seek care at an OB/GYN provider. ° °

## 2020-12-29 ENCOUNTER — Ambulatory Visit (INDEPENDENT_AMBULATORY_CARE_PROVIDER_SITE_OTHER): Payer: Medicaid Other | Admitting: Physician Assistant

## 2020-12-29 ENCOUNTER — Encounter: Payer: Self-pay | Admitting: Physician Assistant

## 2020-12-29 VITALS — BP 111/74 | HR 111 | Temp 98.0°F | Ht 64.0 in | Wt 210.2 lb

## 2020-12-29 DIAGNOSIS — Z7689 Persons encountering health services in other specified circumstances: Secondary | ICD-10-CM

## 2020-12-29 DIAGNOSIS — D509 Iron deficiency anemia, unspecified: Secondary | ICD-10-CM

## 2020-12-29 DIAGNOSIS — Z23 Encounter for immunization: Secondary | ICD-10-CM | POA: Diagnosis not present

## 2020-12-29 MED ORDER — IRON (FERROUS SULFATE) 325 (65 FE) MG PO TABS: 325.0000 mg | ORAL_TABLET | Freq: Every day | ORAL | 0 refills | Status: DC

## 2020-12-29 NOTE — Progress Notes (Signed)
New Patient Office Visit  Subjective:  Patient ID: Caroline Woods, female    DOB: [birth year]  Age: 30 y.o. MRN: [id number]  CC:  Chief Complaint  Patient presents with   New Patient (Initial Visit)    HPI Caroline Woods presents to establish care. Patient has a past medical history of LGSIL, childhood asthma and ADHD. Patient is concerned about recent labs she obtained for pre-employment with [employer]. Patient has a copy of lab results on her phone. Titer for MMR was low, specifically mumps. Iron and RBC were low, platelets were mildly elevated. Hemoglobin and hematocrit borderline normal. Patient reports a prior history of iron deficiency anemia with her pregnancy. No chest pain, shortness of breath, n/v, bloody stool, or dizziness. Has been feeling tired. Does not take any medications.   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   8/16: W/ Positive higher risk HPV [ ]  Repeat Pap in one year   Vaginal Pap smear, abnormal 2014   Unknown result    Past Surgical History:  Procedure Laterality Date   CESAREAN SECTION N/A 06/18/2017   Procedure: CESAREAN SECTION;  Surgeon: Mora Bellman, MD;  Location: [facility];  Service: Obstetrics;  Laterality: N/A;   INSERTION OF IMPLANON ROD  07/16/2020       NO PAST SURGERIES     REMOVAL OF IMPLANON ROD  07/16/2020        Family History  Problem Relation Age of Onset   Hypertension Mother    Kidney cancer Maternal Grandfather     Social History   Socioeconomic History   Marital status: Single    Spouse name: Not on file   Number of children: Not on file   Years of education: Not on file   Highest education level: Not on file  Occupational History   Not on file  Tobacco Use   Smoking status: Never   Smokeless tobacco: Never  Substance and Sexual Activity   Alcohol use: No    Drug use: No   Sexual activity: Not Currently    Partners: Male    Birth control/protection: None  Other Topics Concern   Not on file  Social History Narrative   Not on file   Social Determinants of Health   Financial Resource Strain: Not on file  Food Insecurity: Not on file  Transportation Needs: Not on file  Physical Activity: Not on file  Stress: Not on file  Social Connections: Not on file  Intimate Partner Violence: Not on file    ROS Review of Systems A fourteen system review of systems was performed and found to be positive as per HPI.  Objective:   Today's Vitals: BP 111/74   Pulse (!) 111   Temp 98 F (36.7 C)   Ht 5' 4"  (1.626 m)   Wt 210 lb 3.2 oz (95.3 kg)   SpO2 99%   BMI 36.08 kg/m   Physical Exam General:  Well Developed, well nourished, appropriate for stated age.  Neuro:  Alert and oriented,  extra-ocular muscles intact  HEENT:  Normocephalic, atraumatic, neck supple Skin:  no gross rash, warm, dry. No ecchymosis noted. Cardiac:  RRR, S1 S2 Respiratory:  CTA B/L, Not using accessory muscles, speaking in full sentences- unlabored. Vascular:  Ext warm, no cyanosis apprec.; cap RF less 2 sec. Psych:  No HI/SI, judgement and  insight good, Euthymic mood. Full Affect.  Assessment & Plan:   Problem List Items Addressed This Visit   None Visit Diagnoses     Encounter to establish care    -  Primary   Iron deficiency anemia, unspecified iron deficiency anemia type       Relevant Medications   Iron, Ferrous Sulfate, 325 (65 Fe) MG TABS   Need for MMR vaccine       Relevant Orders   MMR and varicella combined vaccine subcutaneous (Completed)      Encounter to establish care: -Advised patient to forward hardcopy of recent lab results to extract into chart. -Will administer booster dose of MMR vaccine. Advised to avoid additional vaccines for at least 4 weeks. -Advised to schedule CPE and FBW. -Followed by OB/GYN for female  exam/nexplanon.  Iron deficiency anemia: -Will start iron supplement: ferrous sulfate 325 mg once daily. Advised to take with orange or Vitamin C for better absorption. Discussed potential side effects including constipation. Advised to let me know if unable to tolerate medication. -Will repeat CBC and iron panel in 12 weeks. -Provided handout on iron-rich diet.    Outpatient Encounter Medications as of 12/29/2020  Medication Sig   Iron, Ferrous Sulfate, 325 (65 Fe) MG TABS Take 325 mg by mouth daily.   albuterol (VENTOLIN HFA) 108 (90 Base) MCG/ACT inhaler Inhale 1-2 puffs into the lungs every 6 (six) hours as needed for wheezing or shortness of breath. (Patient not taking: No sig reported)   [DISCONTINUED] ferrous sulfate 325 (65 FE) MG tablet Take 1 tablet (325 mg total) by mouth 2 (two) times daily with a meal. (Patient not taking: Reported on 12/23/2019)   [DISCONTINUED] lisdexamfetamine (VYVANSE) 50 MG capsule Take 50 mg by mouth 3 (three) times a week. Patient takes MWF. (Patient not taking: Reported on 12/23/2019)   No facility-administered encounter medications on file as of 12/29/2020.    Follow-up: Return in about 3 months (around 03/31/2021) for CPE and FBW.   Lorrene Reid, PA-C

## 2020-12-29 NOTE — Patient Instructions (Signed)
Ferrous sulfate 325 mg once daily. Take with orange juice or Vitamin C.   Iron-Rich Diet Iron is a mineral that helps your body produce hemoglobin. Hemoglobin is a protein in red blood cells that carries oxygen to your body's tissues. Eating too little iron may cause you to feel weak and tired, and it can increase your risk of infection. Iron is naturally found in many foods, and many foods have iron added to them (are iron-fortified). You may need to follow an iron-rich diet if you do not have enough iron in your body due to certain medical conditions. The amount of iron that you need each day depends on your age, your sex, and any medical conditions you have. Follow instructions from your health care provider or a dietitian about how much iron you should eat each day. What are tips for following this plan? Reading food labels Check food labels to see how many milligrams (mg) of iron are in each serving. Cooking Cook foods in pots and pans that are made from iron. Take these steps to make it easier for your body to absorb iron from certain foods: Soak beans overnight before cooking. Soak whole grains overnight and drain them before using. Ferment flours before baking, such as by using yeast in bread dough. Meal planning When you eat foods that contain iron, you should eat them with foods that are high in vitamin C. These include oranges, peppers, tomatoes, potatoes, and mangoes. Vitamin C helps your body absorb iron. Certain foods and drinks prevent your body from absorbing iron properly. Avoid eating these foods in the same meal as iron-rich foods or with iron supplements. These foods include: Coffee, black tea, and red wine. Milk, dairy products, and foods that are high in calcium. Beans and soybeans. Whole grains. General information Take iron supplements only as told by your health care provider. An overdose of iron can be life-threatening. If you were prescribed iron supplements, take  them with orange juice or a vitamin C supplement. When you eat iron-fortified foods or take an iron supplement, you should also eat foods that naturally contain iron, such as meat, poultry, and fish. Eating naturally iron-rich foods helps your body absorb the iron that is added to other foods or contained in a supplement. Iron from animal sources is better absorbed than iron from plant sources. What foods should I eat? Fruits Prunes. Raisins. Eat fruits high in vitamin C, such as oranges, grapefruits, and strawberries, with iron-rich foods. Vegetables Spinach (cooked). Green peas. Broccoli. Fermented vegetables. Eat vegetables high in vitamin C, such as leafy greens, potatoes, bell peppers, and tomatoes, with iron-rich foods. Grains Iron-fortified breakfast cereal. Iron-fortified whole-wheat bread. Enriched rice. Sprouted grains. Meats and other proteins Beef liver. Beef. [place]. Chicken. Oysters. Shrimp. Tuna. Sardines. Chickpeas. Nuts. Tofu. Pumpkin seeds. Beverages Tomato juice. Fresh orange juice. Prune juice. Hibiscus tea. Iron-fortified instant breakfast shakes. Sweets and desserts Blackstrap molasses. Seasonings and condiments Tahini. Fermented soy sauce. Other foods Wheat germ. The items listed above may not be a complete list of recommended foods and beverages. Contact a dietitian for more information. What foods should I limit? These are foods that should be limited while eating iron-rich foods as they can reduce the absorption of iron in your body. Grains Whole grains. Bran cereal. Bran flour. Meats and other proteins Soybeans. Products made from soy protein. Black beans. Lentils. Mung beans. Split peas. Dairy Milk. Cream. Cheese. Yogurt. Cottage cheese. Beverages Coffee. Black tea. Red wine. Sweets and desserts Cocoa. Chocolate.  Ice cream. Seasonings and condiments Basil. Oregano. Large amounts of parsley. The items listed above may not be a complete list of foods and  beverages you should limit. Contact a dietitian for more information. Summary Iron is a mineral that helps your body produce hemoglobin. Hemoglobin is a protein in red blood cells that carries oxygen to your body's tissues. Iron is naturally found in many foods, and many foods have iron added to them (are iron-fortified). When you eat foods that contain iron, you should eat them with foods that are high in vitamin C. Vitamin C helps your body absorb iron. Certain foods and drinks prevent your body from absorbing iron properly, such as whole grains and dairy products. You should avoid eating these foods in the same meal as iron-rich foods or with iron supplements. This information is not intended to replace advice given to you by your health care provider. Make sure you discuss any questions you have with your health care provider. Document Revised: 02/24/2020 Document Reviewed: 02/24/2020 Elsevier Patient Education  2022 [employer].

## 2021-01-01 ENCOUNTER — Encounter: Payer: Self-pay | Admitting: Family Medicine

## 2021-04-01 ENCOUNTER — Other Ambulatory Visit: Payer: Self-pay

## 2021-04-01 DIAGNOSIS — D509 Iron deficiency anemia, unspecified: Secondary | ICD-10-CM

## 2021-04-01 DIAGNOSIS — Z130 Encounter for screening for diseases of the blood and blood-forming organs and certain disorders involving the immune mechanism: Secondary | ICD-10-CM

## 2021-04-01 DIAGNOSIS — Z1321 Encounter for screening for nutritional disorder: Secondary | ICD-10-CM

## 2021-04-01 DIAGNOSIS — Z0000 Encounter for general adult medical examination without abnormal findings: Secondary | ICD-10-CM

## 2021-04-02 ENCOUNTER — Other Ambulatory Visit: Payer: Self-pay

## 2021-04-02 ENCOUNTER — Other Ambulatory Visit: Payer: 59

## 2021-04-02 DIAGNOSIS — Z1321 Encounter for screening for nutritional disorder: Secondary | ICD-10-CM

## 2021-04-02 DIAGNOSIS — D509 Iron deficiency anemia, unspecified: Secondary | ICD-10-CM

## 2021-04-02 DIAGNOSIS — Z130 Encounter for screening for diseases of the blood and blood-forming organs and certain disorders involving the immune mechanism: Secondary | ICD-10-CM

## 2021-04-02 DIAGNOSIS — Z0000 Encounter for general adult medical examination without abnormal findings: Secondary | ICD-10-CM

## 2021-04-03 LAB — LIPID PANEL
Chol/HDL Ratio: 4.3 ratio (ref 0.0–4.4)
Cholesterol, Total: 145 mg/dL (ref 100–199)
HDL: 34 mg/dL — ABNORMAL LOW (ref >39)
LDL Chol Calc (NIH): 87 mg/dL (ref 0–99)
Triglycerides: 132 mg/dL (ref 0–149)
VLDL Cholesterol Cal: 24 mg/dL (ref 5–40)

## 2021-04-03 LAB — CBC WITH DIFFERENTIAL/PLATELET
Basophils Absolute: 0.1 10*3/uL (ref 0.0–0.2)
Basos: 1 %
EOS (ABSOLUTE): 0.2 10*3/uL (ref 0.0–0.4)
Eos: 3 %
Hematocrit: 42.3 % (ref 34.0–46.6)
Hemoglobin: 14.2 g/dL (ref 11.1–15.9)
Immature Grans (Abs): 0 10*3/uL (ref 0.0–0.1)
Immature Granulocytes: 0 %
Lymphocytes Absolute: 1.9 10*3/uL (ref 0.7–3.1)
Lymphs: 33 %
MCH: 31.1 pg (ref 26.6–33.0)
MCHC: 33.6 g/dL (ref 31.5–35.7)
MCV: 93 fL (ref 79–97)
Monocytes Absolute: 0.5 10*3/uL (ref 0.1–0.9)
Monocytes: 9 %
Neutrophils Absolute: 3.1 10*3/uL (ref 1.4–7.0)
Neutrophils: 54 %
Platelets: 378 10*3/uL (ref 150–450)
RBC: 4.56 x10E6/uL (ref 3.77–5.28)
RDW: 12.2 % (ref 11.7–15.4)
WBC: 5.7 10*3/uL (ref 3.4–10.8)

## 2021-04-03 LAB — IRON,TIBC AND FERRITIN PANEL
Ferritin: 157 ng/mL — ABNORMAL HIGH (ref 15–150)
Iron Saturation: 21 % (ref 15–55)
Iron: 70 ug/dL (ref 27–159)
Total Iron Binding Capacity: 338 ug/dL (ref 250–450)
UIBC: 268 ug/dL (ref 131–425)

## 2021-04-03 LAB — TSH: TSH: 1.78 u[IU]/mL (ref 0.450–4.500)

## 2021-04-03 LAB — COMPREHENSIVE METABOLIC PANEL
ALT: 25 IU/L (ref 0–32)
AST: 17 IU/L (ref 0–40)
Albumin/Globulin Ratio: 1.5 (ref 1.2–2.2)
Albumin: 4.4 g/dL (ref 3.9–5.0)
Alkaline Phosphatase: 51 IU/L (ref 44–121)
BUN/Creatinine Ratio: 15 (ref 9–23)
BUN: 10 mg/dL (ref 6–20)
Bilirubin Total: 0.5 mg/dL (ref 0.0–1.2)
CO2: 23 mmol/L (ref 20–29)
Calcium: 9.6 mg/dL (ref 8.7–10.2)
Chloride: 103 mmol/L (ref 96–106)
Creatinine, Ser: 0.66 mg/dL (ref 0.57–1.00)
Globulin, Total: 2.9 g/dL (ref 1.5–4.5)
Glucose: 99 mg/dL (ref 70–99)
Potassium: 4.4 mmol/L (ref 3.5–5.2)
Sodium: 140 mmol/L (ref 134–144)
Total Protein: 7.3 g/dL (ref 6.0–8.5)
eGFR: 121 mL/min/{1.73_m2} (ref >59)

## 2021-04-03 LAB — HEMOGLOBIN A1C
Est. average glucose Bld gHb Est-mCnc: 120 mg/dL
Hgb A1c MFr Bld: 5.8 % — ABNORMAL HIGH (ref 4.8–5.6)

## 2021-04-09 ENCOUNTER — Encounter: Payer: Self-pay | Admitting: Physician Assistant

## 2021-04-09 ENCOUNTER — Ambulatory Visit (INDEPENDENT_AMBULATORY_CARE_PROVIDER_SITE_OTHER): Payer: 59 | Admitting: Physician Assistant

## 2021-04-09 ENCOUNTER — Other Ambulatory Visit: Payer: Self-pay

## 2021-04-09 VITALS — BP 138/84 | HR 98 | Temp 98.8°F | Ht 64.0 in | Wt 205.0 lb

## 2021-04-09 DIAGNOSIS — Z0000 Encounter for general adult medical examination without abnormal findings: Secondary | ICD-10-CM

## 2021-04-09 DIAGNOSIS — D509 Iron deficiency anemia, unspecified: Secondary | ICD-10-CM | POA: Diagnosis not present

## 2021-04-09 DIAGNOSIS — R7303 Prediabetes: Secondary | ICD-10-CM | POA: Diagnosis not present

## 2021-04-09 NOTE — Progress Notes (Signed)
Subjective:     Caroline Woods is a 31 y.o. female and is here for a comprehensive physical exam. The patient reports no problems.  Social History   Socioeconomic History   Marital status: Single    Spouse name: Not on file   Number of children: Not on file   Years of education: Not on file   Highest education level: Not on file  Occupational History   Not on file  Tobacco Use   Smoking status: Never   Smokeless tobacco: Never  Substance and Sexual Activity   Alcohol use: No   Drug use: No   Sexual activity: Not Currently    Partners: Male    Birth control/protection: None  Other Topics Concern   Not on file  Social History Narrative   Not on file   Social Determinants of Health   Financial Resource Strain: Not on file  Food Insecurity: Not on file  Transportation Needs: Not on file  Physical Activity: Not on file  Stress: Not on file  Social Connections: Not on file  Intimate Partner Violence: Not on file   Health Maintenance  Topic Date Due   Hepatitis C Screening  Never done   COVID-19 Vaccine (3 - Booster for Moderna series) 09/10/2019   PAP SMEAR-Modifier  11/25/2020   TETANUS/TDAP  05/19/2027   INFLUENZA VACCINE  Completed   HIV Screening  Completed   Pneumococcal Vaccine 4-74 Years old  Aged Out   HPV VACCINES  Aged Out    The following portions of the patient's history were reviewed and updated as appropriate: allergies, current medications, past family history, past medical history, past social history, past surgical history, and problem list.  Review of Systems Pertinent items noted in HPI and remainder of comprehensive ROS otherwise negative.   Objective:    BP 138/84    Pulse 98    Temp 98.8 F (37.1 C)    Ht 5\' 4"  (1.626 m)    Wt 205 lb (93 kg)    LMP  (LMP Unknown)    SpO2 95%    BMI 35.19 kg/m  General appearance: alert, cooperative, and no distress Head: Normocephalic, without obvious abnormality, atraumatic Eyes: conjunctivae/corneas  clear. PERRL, EOM's intact. Fundi benign. Ears: normal TM's and external ear canals both ears Nose: Nares normal. Septum midline. Mucosa normal. No drainage or sinus tenderness. Throat: lips, mucosa, and tongue normal; teeth and gums normal Neck: no adenopathy, no JVD, supple, symmetrical, trachea midline, and thyroid: normal to inspection and palpation Back: symmetric, no curvature. ROM normal. No CVA tenderness. Lungs: clear to auscultation bilaterally Heart: regular rate and rhythm, S1, S2 normal, no murmur, click, rub or gallop Abdomen: soft, non-tender; bowel sounds normal; no masses,  no organomegaly Extremities: extremities normal, atraumatic, no cyanosis or edema Pulses: 2+ and symmetric Skin: Skin color, texture, turgor normal. No rashes or lesions Lymph nodes: Cervical adenopathy: normal and Supraclavicular adenopathy: normal Neurologic: Grossly normal    Assessment:    Healthy female exam.     Plan:  -Discussed with patient most recent lab results which are essentially within normal limits or stable from prior with the exception of A1c which is mildly elevated in the prediabetes range. Recommend to continue iron supplement every other day or  three times weekly.  -Followed by OB/GYN for female exam. -Discussed a heart healthy diet low in fat and carbohydrates. -Recommend to use nasal spray such as Nasacort and/or oral antihistamine such as Allegra, Claritin or Zyrtec for seasonal  allergies.  -Recommend to stay well hydrated. -Follow up in 1 year for CPE and FBW.   See After Visit Summary for Counseling Recommendations

## 2021-04-09 NOTE — Patient Instructions (Signed)
Preventive Care 21-31 Years Old, Female °Preventive care refers to lifestyle choices and visits with your health care provider that can promote health and wellness. Preventive care visits are also called wellness exams. °What can I expect for my preventive care visit? °Counseling °During your preventive care visit, your health care provider may ask about your: °Medical history, including: °Past medical problems. °Family medical history. °Pregnancy history. °Current health, including: °Menstrual cycle. °Method of birth control. °Emotional well-being. °Home life and relationship well-being. °Sexual activity and sexual health. °Lifestyle, including: °Alcohol, nicotine or tobacco, and drug use. °Access to firearms. °Diet, exercise, and sleep habits. °Work and work environment. °Sunscreen use. °Safety issues such as seatbelt and bike helmet use. °Physical exam °Your health care provider may check your: °Height and weight. These may be used to calculate your BMI (body mass index). BMI is a measurement that tells if you are at a healthy weight. °Waist circumference. This measures the distance around your waistline. This measurement also tells if you are at a healthy weight and may help predict your risk of certain diseases, such as type 2 diabetes and high blood pressure. °Heart rate and blood pressure. °Body temperature. °Skin for abnormal spots. °What immunizations do I need? °Vaccines are usually given at various ages, according to a schedule. Your health care provider will recommend vaccines for you based on your age, medical history, and lifestyle or other factors, such as travel or where you work. °What tests do I need? °Screening °Your health care provider may recommend screening tests for certain conditions. This may include: °Pelvic exam and Pap test. °Lipid and cholesterol levels. °Diabetes screening. This is done by checking your blood sugar (glucose) after you have not eaten for a while (fasting). °Hepatitis B  test. °Hepatitis C test. °HIV (human immunodeficiency virus) test. °STI (sexually transmitted infection) testing, if you are at risk. °BRCA-related cancer screening. This may be done if you have a family history of breast, ovarian, tubal, or peritoneal cancers. °Talk with your health care provider about your test results, treatment options, and if necessary, the need for more tests. °Follow these instructions at home: °Eating and drinking ° °Eat a healthy diet that includes fresh fruits and vegetables, whole grains, lean protein, and low-fat dairy products. °Take vitamin and mineral supplements as recommended by your health care provider. °Do not drink alcohol if: °Your health care provider tells you not to drink. °You are pregnant, may be pregnant, or are planning to become pregnant. °If you drink alcohol: °Limit how much you have to 0-1 drink a day. °Know how much alcohol is in your drink. In the [place] one drink equals one 12 oz bottle of beer (355 mL), one 5 oz glass of wine (148 mL), or one 1½ oz glass of hard liquor (44 mL). °Lifestyle °Brush your teeth every morning and night with fluoride toothpaste. Floss one time each day. °Exercise for at least 30 minutes 5 or more days each week. °Do not use any products that contain nicotine or tobacco. These products include cigarettes, chewing tobacco, and vaping devices, such as e-cigarettes. If you need help quitting, ask your health care provider. °Do not use drugs. °If you are sexually active, practice safe sex. Use a condom or other form of protection to prevent STIs. °If you do not wish to become pregnant, use a form of birth control. If you plan to become pregnant, see your health care provider for a prepregnancy visit. °Find healthy ways to manage stress, such as: °Meditation, yoga,   or listening to music. °Journaling. °Talking to a trusted person. °Spending time with friends and family. °Minimize exposure to UV radiation to reduce your risk of skin  cancer. °Safety °Always wear your seat belt while driving or riding in a vehicle. °Do not drive: °If you have been drinking alcohol. Do not ride with someone who has been drinking. °If you have been using any mind-altering substances or drugs. °While texting. °When you are tired or distracted. °Wear a helmet and other protective equipment during sports activities. °If you have firearms in your house, make sure you follow all gun safety procedures. °Seek help if you have been physically or sexually abused. °What's next? °Go to your health care provider once a year for an annual wellness visit. °Ask your health care provider how often you should have your eyes and teeth checked. °Stay up to date on all vaccines. °This information is not intended to replace advice given to you by your health care provider. Make sure you discuss any questions you have with your health care provider. °Document Revised: 09/09/2020 Document Reviewed: 09/09/2020 °Elsevier Patient Education © 2022 Elsevier Inc. ° °

## 2021-06-14 ENCOUNTER — Encounter: Payer: Self-pay | Admitting: Physician Assistant

## 2021-10-25 ENCOUNTER — Encounter: Payer: Self-pay | Admitting: Physician Assistant

## 2021-10-25 ENCOUNTER — Ambulatory Visit (INDEPENDENT_AMBULATORY_CARE_PROVIDER_SITE_OTHER): Payer: 59 | Admitting: Physician Assistant

## 2021-10-25 VITALS — BP 113/80 | HR 100 | Temp 97.7°F | Ht 61.0 in | Wt 198.0 lb

## 2021-10-25 DIAGNOSIS — D509 Iron deficiency anemia, unspecified: Secondary | ICD-10-CM

## 2021-10-25 DIAGNOSIS — B9689 Other specified bacterial agents as the cause of diseases classified elsewhere: Secondary | ICD-10-CM | POA: Diagnosis not present

## 2021-10-25 DIAGNOSIS — J988 Other specified respiratory disorders: Secondary | ICD-10-CM | POA: Diagnosis not present

## 2021-10-25 MED ORDER — AZITHROMYCIN 250 MG PO TABS: ORAL_TABLET | ORAL | 0 refills | Status: AC

## 2021-10-25 MED ORDER — DM-GUAIFENESIN ER 30-600 MG PO TB12: 1.0000 | ORAL_TABLET | Freq: Two times a day (BID) | ORAL | 0 refills | Status: DC | PRN

## 2021-10-25 MED ORDER — IRON (FERROUS SULFATE) 325 (65 FE) MG PO TABS: 325.0000 mg | ORAL_TABLET | Freq: Every day | ORAL | 0 refills | Status: DC

## 2021-10-25 NOTE — Patient Instructions (Signed)

## 2021-10-25 NOTE — Progress Notes (Signed)
  Established patient acute visit   Patient: Caroline Woods   DOB: 05-Apr-1990   30 y.o. Female  MRN: [id number] Visit Date: 10/25/2021  Chief Complaint  Patient presents with   Sinusitis        Subjective    HPI HPI     Sinusitis    Additional comments:        Last edited by Sylvester Harder, CMA on 10/25/2021  8:44 AM.      Patient presents with c/o nasal congestion with green mucus, right ear feels full, and has a cough that is both dry and productive x 2 weeks. No sinus pressure, headache, fever, sore throat, chills or shortness of breath. Feeling worse today. Has been taking Xyzal allergy medication.  Medications: Outpatient Medications Prior to Visit  Medication Sig   [DISCONTINUED] Iron, Ferrous Sulfate, 325 (65 Fe) MG TABS Take 325 mg by mouth daily.   No facility-administered medications prior to visit.    Review of Systems Review of Systems:  A fourteen system review of systems was performed and found to be positive as per HPI.     Objective    BP 113/80   Pulse 100   Temp 97.7 F (36.5 C)   Ht 5\' 1"  (1.549 m)   Wt 198 lb (89.8 kg)   SpO2 100%   BMI 37.41 kg/m    Physical Exam  General:  Cooperative, in no acute distress, non-toxic appearing.  Neuro:  Alert and oriented,  extra-ocular muscles intact  HEENT:  Normocephalic, atraumatic, no sinus tenderness, bilateral middle ear effusion (R>L), boggy turbinates, normal posterior oropharynx, neck supple, no cervical adenopathy   Skin:  no gross rash, warm, pink. Cardiac:  RRR, S1 S2 Respiratory: Scattered rhonchi, no wheezing, crackles or rales. Vascular:  Ext warm, no cyanosis apprec.; cap RF less 2 sec. Psych:  No HI/SI, judgement and insight good, Euthymic mood. Full Affect.   No results found for any visits on 10/25/21.  Assessment & Plan     Patient presenting with worsening respiratory symptoms ongoing >10 days so will start antibiotic therapy for bacterial respiratory infection.  Recommend to take decongestant every 12 hours as needed for cough. Recommend to continue home supportive care. Follow-up if symptoms fail to improve or worsen.    Return if symptoms worsen or fail to improve.        10/27/21, PA-C  [facility] [phone] (phone) [phone] (fax)  [facility]

## 2022-07-18 ENCOUNTER — Encounter: Payer: Self-pay | Admitting: Family Medicine

## 2022-07-18 ENCOUNTER — Ambulatory Visit (INDEPENDENT_AMBULATORY_CARE_PROVIDER_SITE_OTHER): Payer: 59 | Admitting: Family Medicine

## 2022-07-18 VITALS — BP 108/74 | HR 114 | Temp 98.9°F | Ht 61.0 in | Wt 198.0 lb

## 2022-07-18 DIAGNOSIS — N644 Mastodynia: Secondary | ICD-10-CM
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
## 2022-07-20 DIAGNOSIS — N644 Mastodynia: Secondary | ICD-10-CM | POA: Insufficient documentation
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
## 2022-08-19 ENCOUNTER — Encounter: Payer: Self-pay | Admitting: Family Medicine

## 2022-08-19 ENCOUNTER — Other Ambulatory Visit (HOSPITAL_COMMUNITY)
Admission: RE | Admit: 2022-08-19 | Discharge: 2022-08-19 | Disposition: A | Discharge status: Home / Self Care | Payer: 59 | Source: Ambulatory Visit | Attending: Family Medicine | Admitting: Family Medicine

## 2022-08-19 ENCOUNTER — Ambulatory Visit (INDEPENDENT_AMBULATORY_CARE_PROVIDER_SITE_OTHER): Payer: 59 | Admitting: Family Medicine

## 2022-08-19 VITALS — BP 127/76 | HR 104 | Ht 61.0 in | Wt 197.0 lb

## 2022-08-19 DIAGNOSIS — Z01419 Encounter for gynecological examination (general) (routine) without abnormal findings: Secondary | ICD-10-CM | POA: Insufficient documentation

## 2022-08-19 DIAGNOSIS — Z124 Encounter for screening for malignant neoplasm of cervix: Secondary | ICD-10-CM | POA: Diagnosis not present

## 2022-08-19 NOTE — Patient Instructions (Signed)
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
## 2022-08-25 LAB — CYTOLOGY - PAP
Comment: NEGATIVE
Diagnosis: NEGATIVE
High risk HPV: NEGATIVE

## 2023-07-03 ENCOUNTER — Ambulatory Visit: Payer: Self-pay | Admitting: Obstetrics & Gynecology

## 2023-07-03 ENCOUNTER — Encounter: Payer: Self-pay | Admitting: Obstetrics & Gynecology

## 2023-07-03 VITALS — BP 136/79 | HR 111 | Wt 189.0 lb

## 2023-07-03 DIAGNOSIS — Z975 Presence of (intrauterine) contraceptive device: Secondary | ICD-10-CM | POA: Insufficient documentation

## 2023-07-03 DIAGNOSIS — Z3046 Encounter for surveillance of implantable subdermal contraceptive: Secondary | ICD-10-CM | POA: Diagnosis not present

## 2023-07-03 MED ORDER — ETONOGESTREL 68 MG ~~LOC~~ IMPL
68.0000 mg | DRUG_IMPLANT | Freq: Once | SUBCUTANEOUS | Status: AC
  Administered 2023-07-03: 68 mg via SUBCUTANEOUS

## 2023-07-03 NOTE — Patient Instructions (Signed)
 Nexplanon Instructions After Insertion  Keep bandage clean and dry for 24 hours  May use ice/Tylenol/Ibuprofen for soreness or pain  If you develop fever, drainage or increased warmth from incision site-contact office immediately

## 2023-07-03 NOTE — Progress Notes (Signed)
    GYNECOLOGY OFFICE PROCEDURE NOTE  Caroline Woods is a 33 y.o. G1P0101 here for Nexplanon removal and re-insertion.  This will be her third Nexplanon in a row, last one was placed in 2022.  Last pap smear was on 08/19/2022 and was normal.  No other gynecologic concerns.  Nexplanon Removal and Reinsertion Patient identified, informed consent performed, consent signed.   Patient does understand that irregular bleeding is a very common side effect of this medication. Discussed other risks and benefits of this contraception modality. She was advised to have backup contraception for one week after replacement of the implant. Appropriate time out taken. Nexplanon site identified in left arm.  Area prepped in usual sterile fashon. One ml of 1% lidocaine was used to anesthetize the area at the distal end of the implant. A small stab incision was made right beside the implant on the distal portion. The Nexplanon rod was grasped using hemostats and removed without difficulty. There was minimal blood loss. There were no complications. Area was then injected with 3 ml of 1 % lidocaine. She was re-prepped with betadine, Nexplanon removed from packaging, Device confirmed in needle, then inserted full length of needle and withdrawn per handbook instructions. Nexplanon was able to palpated in the patient's arm; patient palpated the insert herself.  There was minimal blood loss. Patient insertion site covered with gauze and a pressure bandage to reduce any bruising. The patient tolerated the procedure well and was given post procedure instructions.  Advised that she can have this in place for up 4 years.    Jaynie Collins, MD, FACOG Obstetrician & Gynecologist, [facility] for [employer], [facility]

## 2023-09-01 ENCOUNTER — Encounter: Payer: Self-pay | Admitting: Family Medicine

## 2023-09-01 ENCOUNTER — Ambulatory Visit: Admitting: Family Medicine

## 2023-09-01 VITALS — BP 130/82 | HR 98 | Wt 199.2 lb

## 2023-09-01 DIAGNOSIS — Z975 Presence of (intrauterine) contraceptive device: Secondary | ICD-10-CM | POA: Diagnosis not present

## 2023-09-01 DIAGNOSIS — N912 Amenorrhea, unspecified: Secondary | ICD-10-CM

## 2023-09-01 DIAGNOSIS — Z01419 Encounter for gynecological examination (general) (routine) without abnormal findings: Secondary | ICD-10-CM | POA: Diagnosis not present

## 2023-09-01 DIAGNOSIS — Z1331 Encounter for screening for depression: Secondary | ICD-10-CM | POA: Diagnosis not present

## 2023-09-01 LAB — POCT URINE PREGNANCY: Preg Test, Ur: NEGATIVE
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
## 2023-09-01 NOTE — Progress Notes (Signed)
 Patient presents for Annual.  LMP: No LMP recorded. Patient has had an implant.  Last pap: Date: 2024-WNL  Contraception: Nexplanon  Mammogram: Not yet indicated STD Screening: Declines Flu Vaccine : N/A  CC: Annual Denies any concerns    Wants UPT just to R/O as she has been sexually active and wants to be sure

## 2023-09-02 LAB — COMPREHENSIVE METABOLIC PANEL WITH GFR
ALT: 17 IU/L (ref 0–32)
AST: 16 IU/L (ref 0–40)
Albumin: 4.4 g/dL (ref 3.9–4.9)
Alkaline Phosphatase: 41 IU/L — ABNORMAL LOW (ref 44–121)
BUN/Creatinine Ratio: 19 (ref 9–23)
BUN: 14 mg/dL (ref 6–20)
Bilirubin Total: 0.4 mg/dL (ref 0.0–1.2)
CO2: 21 mmol/L (ref 20–29)
Calcium: 9.2 mg/dL (ref 8.7–10.2)
Chloride: 104 mmol/L (ref 96–106)
Creatinine, Ser: 0.74 mg/dL (ref 0.57–1.00)
Globulin, Total: 3.3 g/dL (ref 1.5–4.5)
Glucose: 94 mg/dL (ref 70–99)
Potassium: 4.7 mmol/L (ref 3.5–5.2)
Sodium: 139 mmol/L (ref 134–144)
Total Protein: 7.7 g/dL (ref 6.0–8.5)
eGFR: 110 mL/min/{1.73_m2} (ref >59)

## 2023-09-02 LAB — CBC
Hematocrit: 43.9 % (ref 34.0–46.6)
Hemoglobin: 14.3 g/dL (ref 11.1–15.9)
MCH: 31.7 pg (ref 26.6–33.0)
MCHC: 32.6 g/dL (ref 31.5–35.7)
MCV: 97 fL (ref 79–97)
Platelets: 374 10*3/uL (ref 150–450)
RBC: 4.51 x10E6/uL (ref 3.77–5.28)
RDW: 11.8 % (ref 11.7–15.4)
WBC: 5.9 10*3/uL (ref 3.4–10.8)

## 2023-09-02 LAB — LIPID PANEL
Chol/HDL Ratio: 3.7 ratio (ref 0.0–4.4)
Cholesterol, Total: 153 mg/dL (ref 100–199)
HDL: 41 mg/dL (ref >39)
LDL Chol Calc (NIH): 93 mg/dL (ref 0–99)
Triglycerides: 103 mg/dL (ref 0–149)
VLDL Cholesterol Cal: 19 mg/dL (ref 5–40)

## 2023-09-02 LAB — HEMOGLOBIN A1C
Est. average glucose Bld gHb Est-mCnc: 111 mg/dL
Hgb A1c MFr Bld: 5.5 % (ref 4.8–5.6)

## 2023-09-02 LAB — TSH RFX ON ABNORMAL TO FREE T4: TSH: 1.59 u[IU]/mL (ref 0.450–4.500)

## 2023-09-02 LAB — VITAMIN D 25 HYDROXY (VIT D DEFICIENCY, FRACTURES): Vit D, 25-Hydroxy: 31.5 ng/mL (ref 30.0–100.0)

## 2023-09-06 ENCOUNTER — Ambulatory Visit: Payer: Self-pay | Admitting: Family Medicine

## 2023-09-25 ENCOUNTER — Encounter (INDEPENDENT_AMBULATORY_CARE_PROVIDER_SITE_OTHER): Payer: Self-pay
